# Patient Record
Sex: Female | Born: 1970 | Race: White | Hispanic: No | Marital: Married | Smoking: Never smoker
Health system: Southern US, Community
[De-identification: ages and names within clinical notes are randomized; demographics above are authoritative.]

## PROBLEM LIST (undated history)

## (undated) ENCOUNTER — Emergency Department (HOSPITAL_BASED_OUTPATIENT_CLINIC_OR_DEPARTMENT_OTHER): Admission: EM | Payer: 59

## (undated) DIAGNOSIS — E079 Disorder of thyroid, unspecified: Secondary | ICD-10-CM

## (undated) DIAGNOSIS — J45909 Unspecified asthma, uncomplicated: Secondary | ICD-10-CM

## (undated) HISTORY — DX: Unspecified asthma, uncomplicated: J45.909

## (undated) HISTORY — DX: Disorder of thyroid, unspecified: E07.9

## (undated) HISTORY — PX: UMBILICAL HERNIA REPAIR: SHX196

---

## 1997-11-02 ENCOUNTER — Other Ambulatory Visit: Admission: RE | Admit: 1997-11-02 | Discharge: 1997-11-02 | Payer: Self-pay | Admitting: Gynecology

## 1998-11-25 ENCOUNTER — Other Ambulatory Visit: Admission: RE | Admit: 1998-11-25 | Discharge: 1998-11-25 | Payer: Self-pay | Admitting: Gynecology

## 2000-01-16 ENCOUNTER — Other Ambulatory Visit: Admission: RE | Admit: 2000-01-16 | Discharge: 2000-01-16 | Payer: Self-pay | Admitting: Obstetrics and Gynecology

## 2000-05-19 ENCOUNTER — Ambulatory Visit (HOSPITAL_COMMUNITY): Admission: RE | Admit: 2000-05-19 | Discharge: 2000-05-19 | Payer: Self-pay | Admitting: Obstetrics and Gynecology

## 2000-06-17 ENCOUNTER — Inpatient Hospital Stay (HOSPITAL_COMMUNITY): Admission: AD | Admit: 2000-06-17 | Discharge: 2000-06-17 | Payer: Self-pay | Admitting: Obstetrics and Gynecology

## 2000-06-19 ENCOUNTER — Inpatient Hospital Stay (HOSPITAL_COMMUNITY): Admission: AD | Admit: 2000-06-19 | Discharge: 2000-06-24 | Payer: Self-pay | Admitting: Obstetrics and Gynecology

## 2000-06-20 ENCOUNTER — Encounter: Payer: Self-pay | Admitting: Obstetrics and Gynecology

## 2000-06-23 ENCOUNTER — Encounter: Payer: Self-pay | Admitting: Obstetrics & Gynecology

## 2000-06-28 ENCOUNTER — Encounter (HOSPITAL_COMMUNITY): Admission: RE | Admit: 2000-06-28 | Discharge: 2000-07-08 | Payer: Self-pay | Admitting: Obstetrics and Gynecology

## 2000-07-01 ENCOUNTER — Encounter: Payer: Self-pay | Admitting: Obstetrics and Gynecology

## 2000-07-07 ENCOUNTER — Inpatient Hospital Stay (HOSPITAL_COMMUNITY): Admission: AD | Admit: 2000-07-07 | Discharge: 2000-07-11 | Payer: Self-pay | Admitting: Obstetrics and Gynecology

## 2000-07-07 ENCOUNTER — Encounter (INDEPENDENT_AMBULATORY_CARE_PROVIDER_SITE_OTHER): Payer: Self-pay

## 2000-07-07 ENCOUNTER — Encounter: Payer: Self-pay | Admitting: Obstetrics and Gynecology

## 2000-07-12 ENCOUNTER — Encounter: Admission: RE | Admit: 2000-07-12 | Discharge: 2000-08-11 | Payer: Self-pay | Admitting: Obstetrics and Gynecology

## 2001-09-22 ENCOUNTER — Other Ambulatory Visit: Admission: RE | Admit: 2001-09-22 | Discharge: 2001-09-22 | Payer: Self-pay | Admitting: Obstetrics and Gynecology

## 2002-07-06 ENCOUNTER — Inpatient Hospital Stay (HOSPITAL_COMMUNITY): Admission: AD | Admit: 2002-07-06 | Discharge: 2002-07-06 | Payer: Self-pay | Admitting: Obstetrics and Gynecology

## 2002-08-17 ENCOUNTER — Ambulatory Visit (HOSPITAL_COMMUNITY): Admission: RE | Admit: 2002-08-17 | Discharge: 2002-08-17 | Payer: Self-pay | Admitting: Obstetrics and Gynecology

## 2002-10-25 ENCOUNTER — Inpatient Hospital Stay (HOSPITAL_COMMUNITY): Admission: AD | Admit: 2002-10-25 | Discharge: 2002-10-28 | Payer: Self-pay | Admitting: Obstetrics and Gynecology

## 2002-10-25 ENCOUNTER — Encounter (INDEPENDENT_AMBULATORY_CARE_PROVIDER_SITE_OTHER): Payer: Self-pay

## 2002-12-05 ENCOUNTER — Other Ambulatory Visit: Admission: RE | Admit: 2002-12-05 | Discharge: 2002-12-05 | Payer: Self-pay | Admitting: Obstetrics and Gynecology

## 2003-12-27 ENCOUNTER — Other Ambulatory Visit: Admission: RE | Admit: 2003-12-27 | Discharge: 2003-12-27 | Payer: Self-pay | Admitting: Obstetrics and Gynecology

## 2005-01-12 ENCOUNTER — Other Ambulatory Visit: Admission: RE | Admit: 2005-01-12 | Discharge: 2005-01-12 | Payer: Self-pay | Admitting: Obstetrics and Gynecology

## 2009-02-15 ENCOUNTER — Encounter: Admission: RE | Admit: 2009-02-15 | Discharge: 2009-02-15 | Payer: Self-pay | Admitting: Obstetrics and Gynecology

## 2009-08-19 ENCOUNTER — Encounter: Admission: RE | Admit: 2009-08-19 | Discharge: 2009-08-19 | Payer: Self-pay | Admitting: Obstetrics and Gynecology

## 2010-08-22 NOTE — Op Note (Signed)
   Jennifer Carlson, Jennifer Carlson                          ACCOUNT NO.:  [id number]   MEDICAL RECORD NO.:  [id number]                   PATIENT TYPE:  INP   LOCATION:  9129                                 FACILITY:  [facility]   PHYSICIAN:  Timothy E. Earlene Plater, M.D.              DATE OF BIRTH:  1970-10-23   DATE OF PROCEDURE:  10/25/2002  DATE OF DISCHARGE:                                 OPERATIVE REPORT   PREOPERATIVE DIAGNOSIS:  Umbilical hernia.   POSTOPERATIVE DIAGNOSIS:  Umbilical hernia.   PROCEDURE:  Repair of umbilical hernia.   SURGEON:  Timothy E. Earlene Plater, M.D.   ASSISTANT:  Dineen Kid. Rana Snare, M.D.   ANESTHESIA:  Spinal.   [name] is 40 years old, is delivering her second pregnancy today by  cesarean section.  She has had an enlarging umbilical hernia following the  pregnancy and delivery of her twins two years ago, and she elects for  elective repair at this time.  She has been informed, counseled.  She is  identified and the permit signed.   The patient is under spinal anesthesia, having been delivered of a viable  female baby this very morning by Dr. Rana Snare.  Please see a separate note.  The  patient is stable.  She is awake and alert.  She is having no pain in the  abdomen.  The abdomen had been previously prepped and draped.  An  infraumbilical incision made, the peritoneal sac easily identified.  It was  grasped, dissected from the surrounding scant subcutaneous tissue.  The  fascial edges were indistinct and somewhat problematic.  In the early stages  of her pregnancy the fascial edges were easy to delineate.  So with further  dissection I was able to grasp the edges of the fascia on either side of the  midline and approximate them with buried sutures of 0 Prolene.  This was  done under direct vision, of course.  The peritoneal sac was excised and  closed with a Vicryl and the hernia repair was complete.  The subcuticular  tissue was closed with a running 3-0 Monocryl.   Steri-Strips applied.  She  tolerated it well.  Final counts correct.  She was removed from the recovery  room in good condition.   She will be admitted and cared for by Dr. Rana Snare and will see me in follow-up.                                               Timothy E. Earlene Plater, M.D.   TED/MEDQ  D:  10/25/2002  T:  10/25/2002  Job:  [id number]   cc:   Dineen Kid. Rana Snare, M.D.  [address]  [place]  [place] [address]  Fax: [phone]

## 2010-08-22 NOTE — Discharge Summary (Signed)
[facility]  Patient:    Jennifer Carlson, Jennifer Carlson                       MRN: [id number] Adm. Date:  [id number] Disc. Date: 06/24/00 Attending:  Cordelia Pen Ii Dictator:   Danie Chandler, R.N.                           Discharge Summary  ADMITTING DIAGNOSES:          Intrauterine pregnancy at 50 1/[redacted] weeks gestation with preterm labor and twin gestation.  DISCHARGE DIAGNOSES:          Intrauterine pregnancy at 5 1/[redacted] weeks gestation with preterm labor and twin gestation, questionable discordancy, two vessel cord of twin A.  PROCEDURE:                    Magnesium sulfate tocolysis, intravenous antibiotics, betamethasone.  REASON FOR ADMISSION:         The patient is a 40 year old married white female gravida 1, para 0 with an estimated date of confinement of Aug 20, 2000 placing her at approximately 75 1/7 weeks estimated gestational age.  The pregnancy has been complicated by a twin gestation which was treated with terbutaline three days ago and the patient has been on p.o. terbutaline, a group B beta strep status which is pending for which the patient has been on Ceftin and asthma for which the patient uses a Maxair inhaler as needed.  The patient was complaining of uterine contractions.  There was no decrease with p.o. terbutaline.  There was no rupture of membranes or bleeding.  The cervix on admission was 1 cm, 50%, ballottable, and soft.  Fetal heart tones were reactive and the patient was having uterine contractions every two to four minutes.  Thus, the patient was admitted with preterm labor, placed on magnesium sulfate tocolysis, Ancef, and betamethasone.  LABORATORIES:                 Hemoglobin 11.6, hematocrit 33.1, white blood cell count 12.1.                                On hospitalization day #1 the patient was without complaint.  The patient was continued on magnesium sulfate and on hospitalization day #2 the magnesium sulfate  was weaned.  The insurance would not cover a subcutaneous terbutaline pump so the patient was placed on p.o. terbutaline.  On hospitalization day #3 the patient was having minimal contractions on the p.o. terbutaline.  Fetal heart tones were reactive.  An ultrasound did show discordant growth.  On hospitalization day #4 the patient was continued on p.o. terbutaline.  The ultrasound was consistent with discordancy.  The home uterine activity monitor was arranged for the patient to be discharged home and on the following day she was discharged home without complaint.  CONDITION ON DISCHARGE:       Stable.  FOLLOW-UP:                    She is to follow-up in the office in the morning.  She is to call for any signs or symptoms of preterm labor.  She will follow-up in the office for serial ultrasounds and monitoring.  DISCHARGE MEDICATIONS:        1. Prenatal vitamins one p.o. q.d.  2. Terbutaline as directed by M.D.                               3. Home uterine activity monitor. DD:  07/07/00 TD:  07/07/00 Job: [id number]

## 2010-08-22 NOTE — H&P (Signed)
[facility]  Patient:    KAITLYNNE, WENZ                       MRN: [id number] Adm. Date:  [id number] Attending:  Cordelia Pen Ii                         History and Physical  HISTORY OF PRESENT ILLNESS:  Ms. Gavin is a 40 year old, G1, P0 at 106 and 6/7th weeks estimated gestational age with a twin pregnancy. They are presumed monochorionic female infants. The pregnancy has been complicated by preterm labor and at 31 weeks discordant weight with twin A measuring 55th percentile at 31 weeks, twin B 25th percentile, at 70 weeks twin A was 45th percentile with twin B measuring 3rd percentile. The patient underwent Doppler flow which showed twin A with normal Doppler flow with an SD ratio of 2.2, twin B showed an elevated SD ratio of 3.8, but at that time had normal biophysical profile. She had a reactive nonstress test yesterday. Her cervix was closed, 50%, -3. She underwent a Doppler ultrasound today which showed worsening Doppler flow of twin B now with a Doppler flow of 4.85 with twin A normal. Again, the upper limits of normal is 3.3. Her estimated date of confinement is Aug 19, 2000. Because of worsening Doppler flow of twin B, intrauterine growth restriction planned delivery of the twins discussed at length of options with the patient and she presents today for monitoring and primary cesarean section.  Her blood type is RH negative with her husband B positive. She received RhoGAM at 28 weeks.  PAST MEDICAL HISTORY:   Asthma.  PAST SURGICAL HISTORY:  Negative.  PAST GYN HISTORY:  No history of sexually transmitted diseases or abnormal Pap smears.  CURRENT MEDICATIONS:  Terbutaline.  ALLERGIES:  No known drug allergies.  PHYSICAL EXAMINATION:  VITAL SIGNS:  Blood pressure 120/80. CARDIOVASCULAR:  Regular rate and rhythm. LUNGS:  Clear to auscultation bilaterally. ABDOMEN:  Nondistended, but gravid, fundal height 40 cm. PELVIC:   Cervix is closed, 50% and -3.  IMPRESSION AND PLAN:  Twin pregnancy, vertex breech presentation, twin B has intrauterine growth restriction which is worsening with estimated fetal weight of less than 3rd percentile and an SD ratio continuing to rise with the latest ratio of 4.85. Because of worsening intrauterine growth rate will plan delivery of the twins. Had a lengthy discussion with the patient and her husband. They desire delivery at this time. Also, with the neonatal intensive care nursery, they are ready for these deliveries because of baby B being breech and growth restricted, planned primary low transverse cesarean section. The risks and benefits were discussed and informed consent was obtained. DD:  07/07/00 TD:  07/07/00 Job: [id number]

## 2010-08-22 NOTE — Op Note (Signed)
Jennifer Carlson, Jennifer Carlson                          ACCOUNT NO.:  [id number]   MEDICAL RECORD NO.:  [id number]                   PATIENT TYPE:  INP   LOCATION:  9129                                 FACILITY:  [facility]   PHYSICIAN:  Dineen Kid. Rana Snare, M.D.                 DATE OF BIRTH:  June 11, 1970   DATE OF PROCEDURE:  10/25/2002  DATE OF DISCHARGE:                                 OPERATIVE REPORT   PREOPERATIVE DIAGNOSES:  1. Intrauterine pregnancy at 39 weeks.  2. Previous cesarean section and desires repeat.  3. Umbilical hernia.   POSTOPERATIVE DIAGNOSES:  1. Intrauterine pregnancy at 39 weeks.  2. Previous cesarean section and desires repeat.  3. Umbilical hernia.   PROCEDURE:  Repeat low segment transverse cesarean section and umbilical  herniorrhaphy per Dr. Earlene Plater.   ANESTHESIA:  Spinal.   ESTIMATED BLOOD LOSS:  800 mL.   SURGEON:  Dineen Kid. Rana Snare, M.D.   ASSISTANT:  Juluis Mire, M.D.   INDICATIONS FOR PROCEDURE:  Ms. Flenniken is a 40 year old G2, P1 with twins  at home who had an uncomplicated pregnancy with previous cesarean section  and desires repeat.  She presents today for this.  The risks and benefits  were discussed.  Informed consent was obtained.  She does have an umbilical  hernia which she is going to have repaired by Dr. Earlene Plater at the same time.   FINDINGS:  A viable female infant with Apgars 9 and 9, pH 7.30, and weight  is 7 pounds 11 ounces.   DESCRIPTION OF PROCEDURE:  After adequate analgesia, the patient was placed  in the supine position with left lateral tilt.  She was sterilely prepped  and draped.  The bladder was sterilely drained with Foley catheter.  A  Pfannenstiel skin incision was made, the previous skin incision was sharply  excised.  The fascia was taken down sharply in the midline. It was extended  superiorly and inferiorly off the bellies of the rectus muscle which was  separated sharply in the midline.  The peritoneum was entered sharply.  Bladder flap was created and replaced by the bladder blade.  A low segment  _______ incision is taken down to the infant's vertex.  It is extended  laterally with the operator's fingertips.  The infant was delivered, nares  and pharynx suctioned.  Cord was clamped, cut and handed to the pediatrician  for resuscitation.  Cord blood was obtained.  Placenta extracted manually.  The uterus was exteriorized, wiped with a dry lap.  The myotomy incision was  closed in two layers, the first being a running locked and the second being  an imbricating layer.  After adequate hemostasis was achieved, the uterus  was placed back in the peritoneal cavity.  After copious amount of  irrigation, adequate hemostasis was assured, 0 Monocryl was used to close  the peritoneum and rectus muscles were plicated in  the midline.  Irrigation  was applied.  After adequate hemostasis, the fascia was closed with #1  Vicryl in running fashion.  Irrigation was supplied after adequate  hemostasis.  The skin was stapled and Steri-Strips were applied.  The  estimated blood loss was 800 mL.  The patient received 1 g of cefotetan  after delivery of the placenta.  The patient was in stable condition and  sponge and instrument counts were normal x3 when Dr. Earlene Plater began his  herniorrhaphy. This will be dictated separately.                                               Dineen Kid Rana Snare, M.D.    DCL/MEDQ  D:  10/25/2002  T:  10/25/2002  Job:  [id number]

## 2010-08-22 NOTE — Discharge Summary (Signed)
[facility]  Patient:    Jennifer Carlson, Jennifer Carlson                       MRN: [id number] Adm. Date:  [id number] Disc. Date: [id number] Attending:  Trevor Iha Dictator:   Danie Chandler, R.N.                           Discharge Summary  ADMISSION DIAGNOSES:          1. Intrauterine two pregnancy at approximately                                  34 weeks estimated gestational age.                               2. Vertex breech presentation.                               3. Intrauterine growth retardation of twin B                                  with poor Doppler of umbilical cord.  DISCHARGE DIAGNOSES:          1. Intrauterine two pregnancy at approximately                                  34 weeks estimated gestational age.                               2. Vertex breech presentation.                               3. Intrauterine growth retardation of twin B                                  with poor Doppler of umbilical cord.  PROCEDURES:                   On July 07, 2000, primary low transverse cesarean section.  REASON FOR ADMISSION:         Please see dictated H&P.  HOSPITAL COURSE:              The patient was taken to the operating room and underwent the above-named procedure without complication.  This was productive of twin A, a viable, female infant with Apgars of 7 at one minute and 8 at five minutes and an arterial cord pH of 7.32, and twin B, a viable, female infant with Apgars of 8 at one minute and 9 at five minutes and an arterial cord pH of 7.34.  Postoperatively, on day #1, the patients hemoglobin was 11.6, hematocrit 32.3, and white blood cell count 12.2.  The patient was without complaint on this day.  On postoperative day #2, the patient was doing well and eating a regular diet.  She had a good return of bowel function and good pain  control.  She was also ambulating well without difficulty.  The babies were stable in the NICU.  On  postoperative day #3, the patient was without complaint.  The babies were continuing to do well in the NICU.  DISPOSITION:                  She as discharged home on postoperative day #4.  CONDITION ON DISCHARGE:       Good.  DIET:                         Regular as tolerated.  ACTIVITY:                     No heavy lifting.  No driving.  No vaginal entry.  FOLLOW-UP:                    She is to follow up in the office in one week for an incision check.  SPECIAL INSTRUCTIONS:         She is to call for a temperature greater than 100 degrees, persistent nausea, vomiting, heavy vaginal bleeding, and/or redness or drainage from the incision site.  DISCHARGE MEDICATIONS:        1. Prenatal vitamins one p.o. q.d.                               2. Tylox as directed by M.D. DD:  07/27/00 TD:  07/27/00 Job: [id number]

## 2010-08-22 NOTE — Op Note (Signed)
[facility]  Patient:    Jennifer Carlson, Jennifer Carlson                       MRN: [id number] Proc. Date: 07/07/00 Adm. Date:  [id number] Disc. Date: [id number] Attending:  Cordelia Pen Ii                           Operative Report  PREOPERATIVE DIAGNOSIS:       Intrauterine twins, vertex breech presentation, 34 weeks estimated gestational age intrauterine growth retardation of twin B with poor Doppler of umbilical cord.  POSTOPERATIVE DIAGNOSIS:      Intrauterine twins, vertex breech presentation, 34 weeks estimated gestational age intrauterine growth retardation of twin B with poor Doppler of umbilical cord.  PROCEDURE:                    Primary low transverse cesarean section.  SURGEON:                      Trevor Iha, M.D.  ANESTHESIA:                   Spinal.  ESTIMATED BLOOD LOSS:         800 cc.  INDICATIONS:                  The patient is a 40 year old G1, P0 at approximately 47 weeks estimated gestational age whose pregnancy has been complicated by discordant twins with IUGR of twin B.  Estimated fetal weight is less than the third percentile.  Doppler flow shows worsening systolic/diastolic ratio with an S/D ratio of 4.85 today.  Because of worsening umbilical artery blood flow and IUGR, plan delivery of the twins. Because of vertex breech presentation and IUGR, plan primary low transverse cesarean section.  The risks and benefits were discussed and informed consent was obtained.  See the history and physical for further details.  FINDINGS AT THE TIME OF SURGERY:              Twin A was a viable female infant with Apgars of 7 and 8.  Arterial pH was 7.32.  Weight was 5 lb 5 oz.  Twin B was a viable female infant with Apgars of 8 and 9.  Arterial pH was 7.34.  Weight was 4 lb 1 oz. Normal uterus, tubes and ovaries were noted.  DESCRIPTION OF PROCEDURE:     After adequate analgesia, the patient was placed in the supine position with a  left lateral tilt.  She was sterilely prepped and draped.  The bladder was sterilely drained.  A Pfannenstiel skin incision was made two fingerbreadths above the pubic symphysis and was taken down sharply to the fascia.  The fascia was incised transversely and extended superiorly and inferiorly and off the bellies of the rectus muscle.  The rectus muscles were separated sharply in the midline.  The peritoneum was then entered sharply.  A bladder blade was placed.  The uterine serosa was elevated, nicked and incised transversely.  A bladder flap was created and placed behind the bladder blade.  A low cervical myotomy incision was made down to the amniotic scan.  Amniotomy was performed.  The vertex of twin A was easily grasped and delivered atraumatically.  The nares and pharynx were suctioned.  The infant was then delivered.  The cord was clamped and the infant was handed  to the pediatricians.  Good cry was noted.  Cord blood was then obtained.  The cord was then marked with umbilical clamp.  Amniotomy was then performed on the bag of twin B.  The buttocks were easily delivered through the incision.  The arms were reduced.  The head was then easily delivered.  The nares and pharynx were then suctioned.  Good cry was noted. The cord was clamped.  The infant was handed to the pediatricians.  Cord blood was then obtained.  The placenta was extracted manually.  Noted was a monochorionic placenta.  There appeared to be two membranes separating the infants consistent with a monochorionic/diamniotic probable monozygotic twins. The uterus was exteriorized and wiped clean with a dry lap.  The myotomy incision was closed in two layers, the first layer being a running locking layer, the second being an imbricating layer with good approximation and good hemostasis.  The uterus was placed back in the peritoneal cavity.  After a copious amount of irrigation, adequate hemostasis was assured.  The  peritoneum was closed with 0 Monocryl  The rectus muscle was plicated in the midline. Irrigation was applied.  After adequate hemostasis, the fascia was closed with 0 Panacryl in a running fashion with good approximation and good hemostasis. Irrigation was again applied and, after adequate hemostasis, the skin was stapled and Steri-Strips applied.  The patient tolerated the procedure well and was stable on transfer to the recovery room.  Sponge, needle and instrument count was normal x 3.  The patient received 1 g of cefotetan after delivery of the placenta. DD:  07/07/00 TD:  07/08/00 Job: [id number]

## 2010-08-22 NOTE — Discharge Summary (Signed)
Jennifer Carlson, Jennifer Carlson                          ACCOUNT NO.:  [id number]   MEDICAL RECORD NO.:  [id number]                   PATIENT TYPE:  INP   LOCATION:  9136                                 FACILITY:  [facility]   PHYSICIAN:  Tracie Harrier, M.D.              DATE OF BIRTH:  14-Nov-1970   DATE OF ADMISSION:  10/25/2002  DATE OF DISCHARGE:  10/28/2002                                 DISCHARGE SUMMARY   ADMISSION DIAGNOSES:  1. Intrauterine pregnancy at 16 weeks estimated gestational age.  2. Previous cesarean, desires repeat.  3. Umbilical hernia.   DISCHARGE DIAGNOSES:  1. Status post low transverse cesarean section.  2. Viable female infant.  3. Repair of umbilical hernia.   PROCEDURE:  1. Repeat low transverse cesarean section.  2. Umbilical hernia repair by Sheppard Plumber. Earlene Plater, M.D. which has been dictated     separately.   REASON FOR ADMISSION:  Please see dictated H&P.   HOSPITAL COURSE:  The patient was a 40 year old gravida 2, para 1 who had  had a previous cesarean delivery for a twin gestation whose pregnancy had  been uncomplicated and the patient had desired to have a repeat cesarean  delivery.  The patient also had an umbilical hernia which was to be repaired  at the time of her surgery.  On the morning of admission, the patient was  taken to the operating room where spinal anesthesia was administered without  difficulty.  A low transverse incision was made with the delivery of a  viable female infant weighing 7 pounds 11 ounces, Apgars of 9 at one minute  9 at five minutes.  Umbilical cord pH was 7.30.  Umbilical hernia repair was  performed by Marcial Pacas E. Earlene Plater, M.D. and the patient the patient tolerated  the procedure well and was taken to the recovery room in stable condition.  On postoperative day #1, vital signs were stable, the patient remained  afebrile, abdomen was soft with good return of bowel function, fundus was  firm and nontender.  Abdominal dressing was  clean, dry, and intact.  Labs  revealed hemoglobin of 11.9, platelet count of 163,000, WBC count of 9.1.  On postoperative day #2, the patient did complain of some tenderness at the  umbilical incision, vital signs were stable, she remained afebrile, fundus  was firm and nontender.  Umbilical incision was noted to be intact without  erythema and no drainage.  Low abdominal incision was noted to have a small  amount of oozing at the left of the midline and at the right margin.  On  postoperative day #3, the patient was doing well, vital signs were stable,  she remained afebrile.  Incision was clean, dry, and intact.  Staples were  removed and the patient was actually discharged home.   CONDITION ON DISCHARGE:  Good.   DIET:  Regular as tolerated.   ACTIVITY:  No heavy lifting,  no driving x2 weeks, no vaginal entry.   FOLLOW UP:  The patient is to followup in the office in one week for an  incision check.  She is to call for a temperature greater than 100 degrees,  persistent nausea and vomiting, heavy vaginal bleeding, and/or redness or  drainage from the incisional site.   DISCHARGE MEDICATIONS:  1. Percocet 5/325 mg, dispense #30, one p.o. q.4-6 h.  2. Motrin 600 mg every 6 hours p.r.n.  3. Prenatal vitamins one p.o. daily.  4. Colace one p.o. daily p.r.n.     Julio Sicks, N.P.                        Tracie Harrier, M.D.    CC/MEDQ  D:  11/21/2002  T:  11/21/2002  Job:  [id number]

## 2010-08-22 NOTE — H&P (Signed)
   Jennifer Carlson                          ACCOUNT NO.:  [id number]   MEDICAL RECORD NO.:  [id number]                   PATIENT TYPE:  INP   LOCATION:  NA                                   FACILITY:  [facility]   PHYSICIAN:  Dineen Kid. Rana Snare, M.D.                 DATE OF BIRTH:  Oct 13, 1970   DATE OF ADMISSION:  10/24/2002  DATE OF DISCHARGE:                                HISTORY & PHYSICAL   HISTORY OF PRESENT ILLNESS:  Jennifer Carlson is a 40 year old G2, P60 with twins  from a previous pregnancy with an estimated date of confinement of November 02, 2002 currently [redacted] weeks pregnant presents for repeat cesarean section.  Her  pregnancy has been uncomplicated.  Her group B strep is negative.  Her  previous cesarean section was due to twins with IUGR but she desires repeat  cesarean section.  She also has an umbilical hernia which is going to be  repaired by Dr. Earlene Plater.   PAST MEDICAL HISTORY:  Asthma.   PAST SURGICAL HISTORY:  Cesarean section April 2002 and wisdom teeth  extraction.   PAST OBSTETRICAL HISTORY:  Twin boys (identical) in April 2002.   MEDICATIONS:  Prenatal vitamins and albuterol as needed.   ALLERGIES:  She is allergic to AUGMENTIN which gives her hives.  The patient  did receive Cefotan with the last delivery and did not have any problems  with it, however.   PHYSICAL EXAMINATION:  VITAL SIGNS:  Blood pressure is 110/72.  HEART:  Regular rate and rhythm.  LUNGS:  Clear to auscultation bilaterally.  ABDOMEN:  Gravid, nontender.  CERVIX:  2, 75%, -2 station.   IMPRESSION AND PLAN:  Intrauterine pregnancy at 39 weeks, previous cesarean  section with desired repeat, also with umbilical hernia.  Plan repeat low  transverse cesarean section.  Risks and benefits were discussed.  Informed  consent was obtained.  Dr. Earlene Plater is to follow with umbilical herniorrhaphy.                                               Dineen Kid Rana Snare, M.D.    DCL/MEDQ  D:  10/24/2002  T:  10/24/2002   Job:  [id number]

## 2010-12-10 IMAGING — US US BREAST L
1 series · 4 of 4 positions shown · non-contrast
Comparison: None, baseline

CLINICAL DATA: Palpable abnormality left breast.

DIGITAL DIAGNOSTIC  BILATERAL  MAMMOGRAM  WITH CAD AND LEFT BREAST
ULTRASOUND:

[Series 1: us breast left · 4 of 4 slices shown]
[im 1/4]
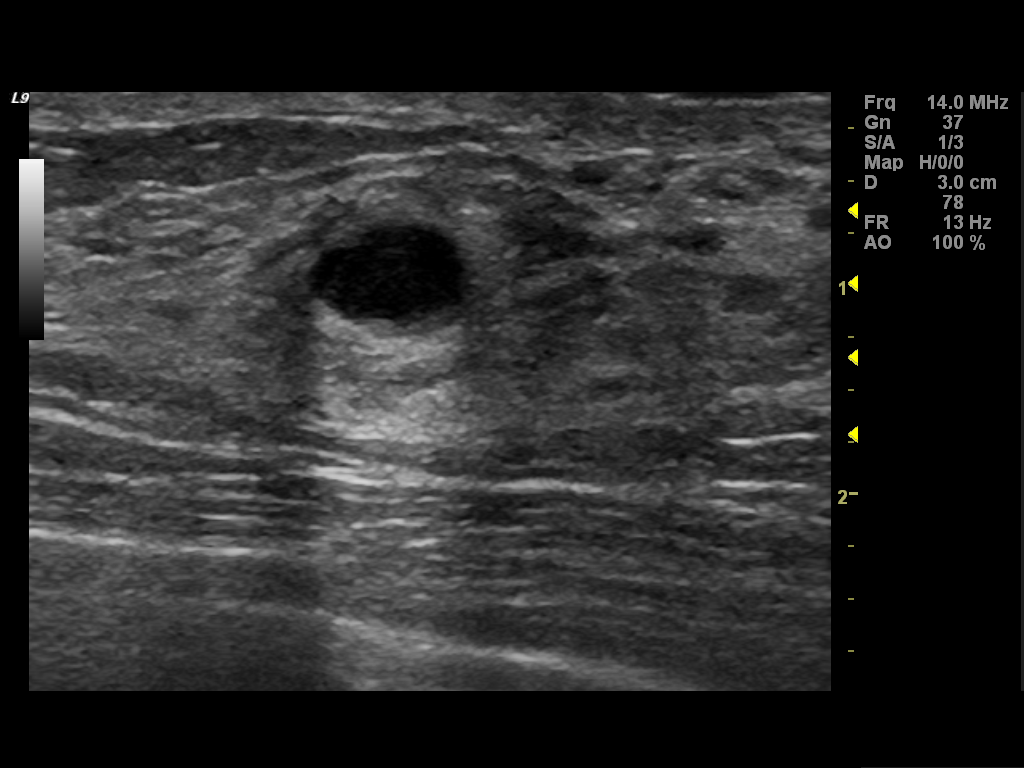
[im 2/4]
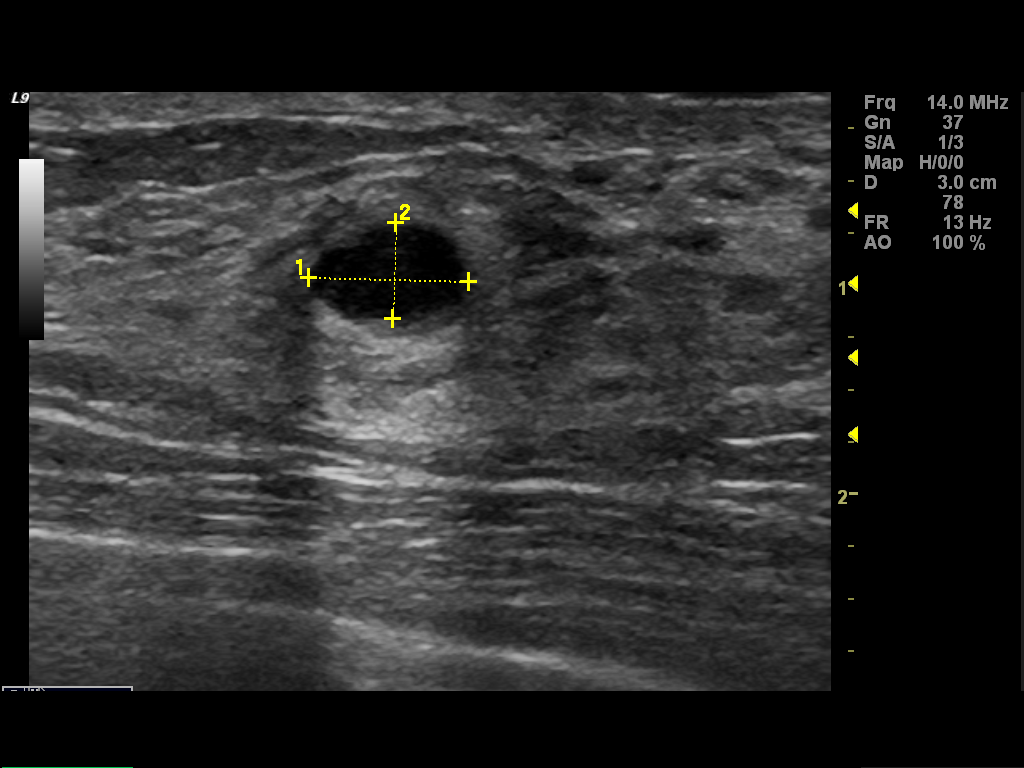
[im 3/4]
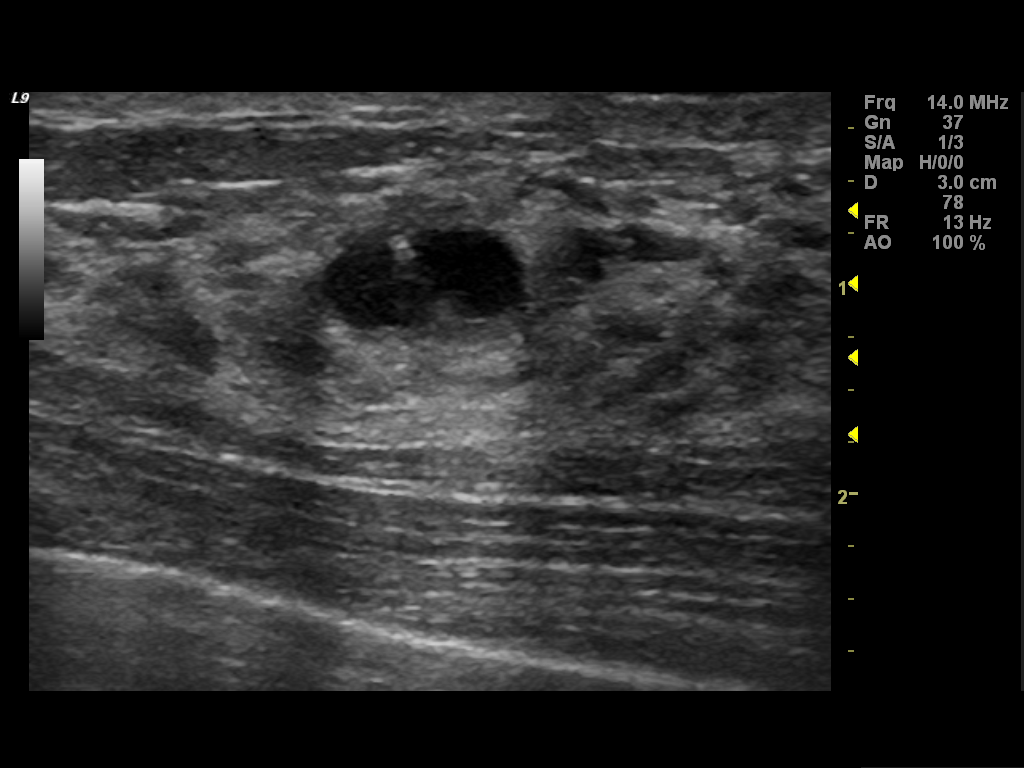
[im 4/4]
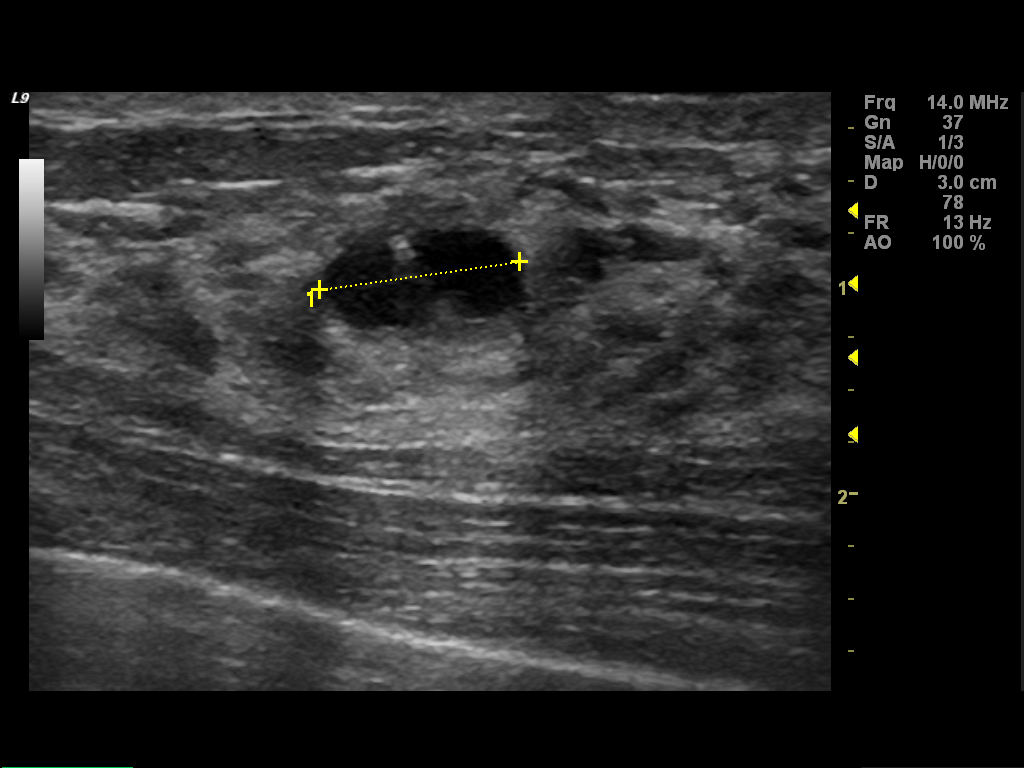

[4 of 4 positions shown; findings below may reference images not displayed]

FINDINGS: Breast parenchyma is extremely dense.  Calcifications are
identified bilaterally, further evaluated with magnified views.  On
the right, calcifications in the 6 o'clock location clearly layer,
consistent with milk of calcium type calcifications.  No mass or
distortion identified on the right.

On the left, faint calcifications are also identified in the 6
o'clock location.  On magnified views, these calcifications are
faint but also appeared later.  Findings are most consistent with
milk of calcium type calcifications and follow-up magnified views
are suggested in 6 months to assess stability.  A BB marks area of
patient's concern on the left, corresponding to the dense
fibroglandular tissue without definite mass or distortion.
Mammographic images were processed with CAD.

On physical exam, I do palpate a rounded nodule the [DATE] position
of the left breast 3 cm from the nipple.

Ultrasound is performed, showing a cyst in the [DATE] position of the
left breast 3 cm from the nipple.  This measures 1.0 x 0.8 x
cm.  Findings are consistent with a thick-walled cyst, containing a
single septation.  No internal blood flow is identified.  Findings
favor an inflamed cyst.  The patient reports that this was quite
tender 1 week ago that tenderness has improved.  No solid mass or
area of acoustic shadowing is identified within this region.
IMPRESSION: 1.  Benign appearing calcifications on the right.
2.  Calcifications on the left probably benign.  I would recommend
a followup with left mammogram to assess stability.
3.  Inflamed cyst [DATE] position of the left breast.  Follow-up left
breast ultrasound is suggested in 6 months.

BI-RADS CATEGORY 3:  Probably benign finding(s) - short interval
follow-up suggested.

## 2015-05-08 DIAGNOSIS — I8311 Varicose veins of right lower extremity with inflammation: Secondary | ICD-10-CM | POA: Diagnosis not present

## 2015-05-08 DIAGNOSIS — I8312 Varicose veins of left lower extremity with inflammation: Secondary | ICD-10-CM | POA: Diagnosis not present

## 2015-05-14 MED FILL — BENZONATATE 100 MG CAPSULE: 100 | 10 days supply | Qty: 30 | Fill #0

## 2015-05-17 MED FILL — TRI-LEGEST FE-28 DAY TABLET: 1-20/1-30/1 | 84 days supply | Qty: 84 | Fill #4

## 2015-05-24 DIAGNOSIS — Z6825 Body mass index (BMI) 25.0-25.9, adult: Secondary | ICD-10-CM | POA: Diagnosis not present

## 2015-05-24 DIAGNOSIS — Z01411 Encounter for gynecological examination (general) (routine) with abnormal findings: Secondary | ICD-10-CM | POA: Diagnosis not present

## 2015-05-24 DIAGNOSIS — R319 Hematuria, unspecified: Secondary | ICD-10-CM | POA: Diagnosis not present

## 2015-05-24 DIAGNOSIS — Z01419 Encounter for gynecological examination (general) (routine) without abnormal findings: Secondary | ICD-10-CM | POA: Diagnosis not present

## 2015-05-24 DIAGNOSIS — Z1231 Encounter for screening mammogram for malignant neoplasm of breast: Secondary | ICD-10-CM | POA: Diagnosis not present

## 2015-05-28 DIAGNOSIS — Z1329 Encounter for screening for other suspected endocrine disorder: Secondary | ICD-10-CM | POA: Diagnosis not present

## 2015-05-28 DIAGNOSIS — Z13220 Encounter for screening for lipoid disorders: Secondary | ICD-10-CM | POA: Diagnosis not present

## 2015-05-28 DIAGNOSIS — Z1321 Encounter for screening for nutritional disorder: Secondary | ICD-10-CM | POA: Diagnosis not present

## 2015-05-28 MED FILL — ZOLPIDEM TARTRATE 10 MG TAB: 10 | 90 days supply | Qty: 90 | Fill #0

## 2015-06-03 DIAGNOSIS — R3121 Asymptomatic microscopic hematuria: Secondary | ICD-10-CM | POA: Diagnosis not present

## 2015-06-03 DIAGNOSIS — Z0000 Encounter for general adult medical examination without abnormal findings: Secondary | ICD-10-CM | POA: Diagnosis not present

## 2015-06-04 ENCOUNTER — Other Ambulatory Visit (HOSPITAL_COMMUNITY): Payer: Self-pay | Admitting: Urology

## 2015-06-04 DIAGNOSIS — R3121 Asymptomatic microscopic hematuria: Secondary | ICD-10-CM

## 2015-06-20 ENCOUNTER — Ambulatory Visit (HOSPITAL_COMMUNITY)
Admission: RE | Admit: 2015-06-20 | Discharge: 2015-06-20 | Disposition: A | Discharge status: Home / Self Care | Payer: 59 | Source: Ambulatory Visit | Attending: Urology | Admitting: Urology

## 2015-06-20 DIAGNOSIS — R3129 Other microscopic hematuria: Secondary | ICD-10-CM | POA: Diagnosis not present

## 2015-06-20 DIAGNOSIS — R3121 Asymptomatic microscopic hematuria: Secondary | ICD-10-CM

## 2015-07-23 DIAGNOSIS — E559 Vitamin D deficiency, unspecified: Secondary | ICD-10-CM | POA: Diagnosis not present

## 2015-08-12 MED FILL — TRI-LEGEST FE-28 DAY TABLET: 1-20/1-30/1 | 84 days supply | Qty: 84 | Fill #0

## 2015-10-29 MED FILL — ZOLPIDEM TARTRATE 10 MG TAB: 10 | 90 days supply | Qty: 90 | Fill #1

## 2015-10-29 MED FILL — TRI-LEGEST FE-28 DAY TABLET: 1-20/1-30/1 | 84 days supply | Qty: 84 | Fill #1

## 2016-01-20 MED FILL — TRI-LEGEST FE-28 DAY TABLET: 1-20/1-30/1 | 84 days supply | Qty: 84 | Fill #2

## 2016-04-02 MED FILL — ZOLPIDEM TARTRATE 10 MG TAB: 10 | 90 days supply | Qty: 90 | Fill #0

## 2016-04-15 MED FILL — TRI-LEGEST FE-28 DAY TABLET: 1-20/1-30/1 | 84 days supply | Qty: 84 | Fill #3

## 2016-05-26 ENCOUNTER — Other Ambulatory Visit: Payer: Self-pay | Admitting: Obstetrics and Gynecology

## 2016-05-26 DIAGNOSIS — R928 Other abnormal and inconclusive findings on diagnostic imaging of breast: Secondary | ICD-10-CM

## 2016-05-29 ENCOUNTER — Ambulatory Visit
Admission: RE | Admit: 2016-05-29 | Discharge: 2016-05-29 | Disposition: A | Discharge status: Home / Self Care | Payer: 59 | Source: Ambulatory Visit | Attending: Obstetrics and Gynecology | Admitting: Obstetrics and Gynecology

## 2016-05-29 DIAGNOSIS — R928 Other abnormal and inconclusive findings on diagnostic imaging of breast: Secondary | ICD-10-CM

## 2016-07-07 MED FILL — TRI-LEGEST FE-28 DAY TABLET: 1-20/1-30/1 | 84 days supply | Qty: 84 | Fill #0

## 2016-08-25 MED FILL — PREVIDENT 5000 BOOSTER PLUS: 1.1 | 30 days supply | Qty: 100 | Fill #0

## 2016-09-23 MED FILL — TRI-LEGEST FE-28 DAY TABLET: 1-20/1-30/1 | 84 days supply | Qty: 84 | Fill #0

## 2016-10-14 MED FILL — ZOLPIDEM TARTRATE 10 MG TAB: 10 | 30 days supply | Qty: 30 | Fill #0

## 2016-10-21 MED FILL — BETAMETHASONE DP AUG 0.05%: 0.05 | 30 days supply | Qty: 60 | Fill #0

## 2016-12-02 MED FILL — PREVIDENT 5000 BOOSTER PLUS: 1.1 | 30 days supply | Qty: 100 | Fill #1

## 2016-12-15 MED FILL — TRI-LEGEST FE-28 DAY TABLET: 1-20/1-30/1 | 84 days supply | Qty: 84 | Fill #1

## 2016-12-29 MED FILL — ZOLPIDEM TARTRATE 10 MG TAB: 10 | 30 days supply | Qty: 30 | Fill #0

## 2017-02-08 MED FILL — PREVIDENT 5000 BOOSTER PLUS: 1.1 | 30 days supply | Qty: 100 | Fill #2

## 2017-02-10 MED FILL — ZOLPIDEM TARTRATE 10 MG TAB: 10 | 30 days supply | Qty: 30 | Fill #0

## 2017-03-03 MED FILL — PAZEO 0.7% EYE DROPS: 0.7 | 25 days supply | Qty: 3 | Fill #0

## 2017-03-16 MED FILL — TRI-LEGEST FE-28 DAY TABLET: 1-20/1-30/1 | 84 days supply | Qty: 84 | Fill #2

## 2017-04-02 MED FILL — PAZEO 0.7% EYE DROPS: 0.7 | 25 days supply | Qty: 3 | Fill #1

## 2017-04-05 MED FILL — ZOLPIDEM TARTRATE 10 MG TAB: 10 | 30 days supply | Qty: 30 | Fill #0

## 2017-05-26 DIAGNOSIS — Z01419 Encounter for gynecological examination (general) (routine) without abnormal findings: Secondary | ICD-10-CM | POA: Diagnosis not present

## 2017-05-26 DIAGNOSIS — Z1231 Encounter for screening mammogram for malignant neoplasm of breast: Secondary | ICD-10-CM | POA: Diagnosis not present

## 2017-05-26 DIAGNOSIS — Z6826 Body mass index (BMI) 26.0-26.9, adult: Secondary | ICD-10-CM | POA: Diagnosis not present

## 2017-05-26 LAB — HM PAP SMEAR: HM PAP: NEGATIVE

## 2017-05-26 MED FILL — TRI-LEGEST FE-28 DAY TABLET: 1-20/1-30/1 | 84 days supply | Qty: 84 | Fill #0

## 2017-05-27 LAB — HM MAMMOGRAPHY

## 2017-06-03 ENCOUNTER — Ambulatory Visit (INDEPENDENT_AMBULATORY_CARE_PROVIDER_SITE_OTHER): Payer: 59 | Admitting: Physician Assistant

## 2017-06-03 ENCOUNTER — Encounter: Payer: Self-pay | Admitting: Physician Assistant

## 2017-06-03 VITALS — BP 133/83 | HR 70 | Resp 14 | Ht 61.0 in | Wt 141.0 lb

## 2017-06-03 DIAGNOSIS — Z7689 Persons encountering health services in other specified circumstances: Secondary | ICD-10-CM

## 2017-06-03 DIAGNOSIS — J4520 Mild intermittent asthma, uncomplicated: Secondary | ICD-10-CM | POA: Diagnosis not present

## 2017-06-03 DIAGNOSIS — G479 Sleep disorder, unspecified: Secondary | ICD-10-CM | POA: Diagnosis not present

## 2017-06-03 DIAGNOSIS — J45990 Exercise induced bronchospasm: Secondary | ICD-10-CM

## 2017-06-03 DIAGNOSIS — Z2821 Immunization not carried out because of patient refusal: Secondary | ICD-10-CM

## 2017-06-03 MED ORDER — ALBUTEROL SULFATE HFA 108 (90 BASE) MCG/ACT IN AERS: INHALATION_SPRAY | RESPIRATORY_TRACT | 3 refills | Status: DC

## 2017-06-03 MED ORDER — ZOLPIDEM TARTRATE 10 MG PO TABS: 10.0000 mg | ORAL_TABLET | Freq: Every evening | ORAL | 2 refills | Status: DC | PRN

## 2017-06-03 MED FILL — ZOLPIDEM TARTRATE 10 MG TAB: 10 | 30 days supply | Qty: 30 | Fill #0

## 2017-06-03 MED FILL — VENTOLIN HFA 90 MCG INHALER: 108 (90 BAS | 30 days supply | Qty: 18 | Fill #0

## 2017-06-03 NOTE — Progress Notes (Signed)
HPI:                                                                Jennifer Carlson is a 47 y.o. female who presents to [facility] today to establish care  Current concerns: medication refills  Asthma: since childhood. Triggered by exertion and allergies. She does endorse intermittent nocturnal chest tightness. No asthma exacerbations in the last year.   Insomnia: takes Ambien about once a week as needed for sleep difficulties due to racing thoughts. No nighttime awakenings. Feels rested.   Depression screen [facility] 2/9 06/03/2017  Decreased Interest 0  Down, Depressed, Hopeless 0  PHQ - 2 Score 0    No flowsheet data found.    History reviewed. No pertinent past medical history. Past Surgical History:  Procedure Laterality Date  . CESAREAN SECTION     x 2 (2002, 2004)  . UMBILICAL HERNIA REPAIR     Social History   Tobacco Use  . Smoking status: Not on file  Substance Use Topics  . Alcohol use: Not on file   family history includes Breast cancer in her paternal grandmother.    ROS: negative except as noted in the HPI  Medications: Current Outpatient Medications  Medication Sig Dispense Refill  . docusate sodium (COLACE) 50 MG capsule Take 50 mg by mouth at bedtime.    . TRI-LEGEST FE 1-20/1-30/1-35 MG-MCG tablet Take 1 tablet by mouth daily.  3  . zolpidem (AMBIEN) 10 MG tablet Take 1 tablet (10 mg total) by mouth at bedtime as needed for sleep. 30 tablet 2  . albuterol (PROVENTIL HFA;VENTOLIN HFA) 108 (90 Base) MCG/ACT inhaler 1-2 puffs 30 minutes prior to exercise 2 Inhaler 3   No current facility-administered medications for this visit.    Allergies  Allergen Reactions  . Augmentin [Amoxicillin-Pot Clavulanate] Hives       Objective:  BP 133/83   Pulse 70   Resp 14   Ht 5\' 1"  (1.549 m)   Wt 141 lb (64 kg)   SpO2 98%   BMI 26.64 kg/m  Gen:  alert, not ill-appearing, no distress, appropriate for  age HEENT: head normocephalic without obvious abnormality, conjunctiva and cornea clear, trachea midline Pulm: Normal work of breathing, normal phonation, clear to auscultation bilaterally, no wheezes, rales or rhonchi CV: Normal rate, regular rhythm, s1 and s2 distinct, no murmurs, clicks or rubs  Neuro: alert and oriented x 3, no tremor MSK: extremities atraumatic, normal gait and station Skin: intact, no rashes on exposed skin, no jaundice, no cyanosis Psych: well-groomed, cooperative, good eye contact, euthymic mood, affect mood-congruent, speech is articulate, and thought processes clear and goal-directed    No results found for this or any previous visit (from the past 72 hour(s)). No results found.    Assessment and Plan: 47 y.o. female with   1. Encounter to establish care - reviewed PMH, PSH, PFH, medications and allergies - Mammogram and Pap smear UTD per patient, requesting records from Dr. Linda Hedges - Tdap and influenza UTD per patient - declines Pnuemovax - negative PHQ2  2. Sleeping difficulties - zolpidem (AMBIEN) 10 MG tablet; Take 1 tablet (10 mg total) by mouth at bedtime as needed for sleep.  Dispense: 30  tablet; Refill: 2  3. Exercise-induced bronchospasm - albuterol (PROVENTIL HFA;VENTOLIN HFA) 108 (90 Base) MCG/ACT inhaler; 1-2 puffs 30 minutes prior to exercise  Dispense: 2 Inhaler; Refill: 3  4. Mild intermittent asthma without complication - well-controlled without controller medication. No exacerbations in the last year. Cont Albuterol prn - albuterol (PROVENTIL HFA;VENTOLIN HFA) 108 (90 Base) MCG/ACT inhaler; 1-2 puffs 30 minutes prior to exercise  Dispense: 2 Inhaler; Refill: 3     Patient education and anticipatory guidance given Patient agrees with treatment plan Follow-up in 6 months for medication management or sooner as needed if symptoms worsen or fail to improve  Darlyne Russian PA-C

## 2017-06-16 ENCOUNTER — Encounter: Payer: Self-pay | Admitting: Physician Assistant

## 2017-08-09 MED FILL — ZOLPIDEM TARTRATE 10 MG TAB: 10 | 30 days supply | Qty: 30 | Fill #1

## 2017-09-01 MED FILL — TRI-LEGEST FE-28 DAY TABLET: 1-20/1-30/1 | 84 days supply | Qty: 84 | Fill #1

## 2017-09-16 DIAGNOSIS — D692 Other nonthrombocytopenic purpura: Secondary | ICD-10-CM | POA: Diagnosis not present

## 2017-09-16 DIAGNOSIS — D2239 Melanocytic nevi of other parts of face: Secondary | ICD-10-CM | POA: Diagnosis not present

## 2017-09-16 DIAGNOSIS — I788 Other diseases of capillaries: Secondary | ICD-10-CM | POA: Diagnosis not present

## 2017-09-16 DIAGNOSIS — D2272 Melanocytic nevi of left lower limb, including hip: Secondary | ICD-10-CM | POA: Diagnosis not present

## 2017-09-16 DIAGNOSIS — D225 Melanocytic nevi of trunk: Secondary | ICD-10-CM | POA: Diagnosis not present

## 2017-09-16 DIAGNOSIS — L814 Other melanin hyperpigmentation: Secondary | ICD-10-CM | POA: Diagnosis not present

## 2017-09-23 ENCOUNTER — Encounter: Payer: Self-pay | Admitting: Physician Assistant

## 2017-09-23 ENCOUNTER — Ambulatory Visit (INDEPENDENT_AMBULATORY_CARE_PROVIDER_SITE_OTHER): Payer: 59 | Admitting: Physician Assistant

## 2017-09-23 VITALS — BP 135/88 | HR 69 | Temp 98.1°F | Wt 141.0 lb

## 2017-09-23 DIAGNOSIS — J4521 Mild intermittent asthma with (acute) exacerbation: Secondary | ICD-10-CM | POA: Diagnosis not present

## 2017-09-23 MED ORDER — BENZONATATE 200 MG PO CAPS: 200.0000 mg | ORAL_CAPSULE | Freq: Every day | ORAL | 0 refills | Status: DC

## 2017-09-23 MED ORDER — PREDNISONE 50 MG PO TABS: 50.0000 mg | ORAL_TABLET | Freq: Every day | ORAL | 0 refills | Status: DC

## 2017-09-23 MED ORDER — AZITHROMYCIN 250 MG PO TABS: ORAL_TABLET | ORAL | 0 refills | Status: DC

## 2017-09-23 MED ORDER — IPRATROPIUM-ALBUTEROL 0.5-2.5 (3) MG/3ML IN SOLN: 3.0000 mL | RESPIRATORY_TRACT | 3 refills | Status: DC | PRN

## 2017-09-23 MED FILL — AZITHROMYCIN 250 MG TABLET: 250 | 5 days supply | Qty: 6 | Fill #0

## 2017-09-23 MED FILL — IPRAT-ALBUT 0.5-3(2.5) MG/3: 0.5-2.5 (3) | 60 days supply | Qty: 360 | Fill #0

## 2017-09-23 MED FILL — BENZONATATE 200 MG CAP: 200 | 20 days supply | Qty: 20 | Fill #0

## 2017-09-23 MED FILL — predniSONE 50 MG TABS: 50 | 5 days supply | Qty: 5 | Fill #0

## 2017-09-23 NOTE — Progress Notes (Signed)
HPI:                                                                Jennifer Carlson is a 47 y.o. female who presents to [facility]: [place] today for cough  Cough  This is a new problem. The current episode started in the past 7 days. The problem has been unchanged. The cough is non-productive. Associated symptoms include chest pain ("tightness"), a sore throat (intermittent) and shortness of breath. Pertinent negatives include no fever, nasal congestion or wheezing. Nothing aggravates the symptoms. She has tried a beta-agonist inhaler for the symptoms. The treatment provided moderate relief. Her past medical history is significant for asthma.      Depression screen [facility] 2/9 06/03/2017  Decreased Interest 0  Down, Depressed, Hopeless 0  PHQ - 2 Score 0    No flowsheet data found.    No past medical history on file. Past Surgical History:  Procedure Laterality Date  . CESAREAN SECTION     x 2 (2002, 2004)  . UMBILICAL HERNIA REPAIR     Social History   Tobacco Use  . Smoking status: Never Smoker  . Smokeless tobacco: Never Used  Substance Use Topics  . Alcohol use: Yes    Alcohol/week: 1.2 oz    Types: 2 Standard drinks or equivalent per week   family history includes Breast cancer in her paternal grandmother.    ROS: negative except as noted in the HPI  Medications: Current Outpatient Medications  Medication Sig Dispense Refill  . albuterol (PROVENTIL HFA;VENTOLIN HFA) 108 (90 Base) MCG/ACT inhaler 1-2 puffs 30 minutes prior to exercise 2 Inhaler 3  . docusate sodium (COLACE) 50 MG capsule Take 50 mg by mouth at bedtime.    . TRI-LEGEST FE 1-20/1-30/1-35 MG-MCG tablet Take 1 tablet by mouth daily.  3  . zolpidem (AMBIEN) 10 MG tablet Take 1 tablet (10 mg total) by mouth at bedtime as needed for sleep. 30 tablet 2   No current facility-administered medications for this visit.    Allergies  Allergen Reactions  . Augmentin  [Amoxicillin-Pot Clavulanate] Hives       Objective:  BP 135/88   Pulse 69   Temp 98.1 F (36.7 C) (Oral)   Wt 141 lb (64 kg)   SpO2 100%   BMI 26.64 kg/m  Gen:  alert, not ill-appearing, no distress, appropriate for age 7: head normocephalic without obvious abnormality, conjunctiva and cornea clear, oropharynx clear, neck supple, no cervical adenopathy, trachea midline Pulm: Normal work of breathing, normal phonation, clear to auscultation bilaterally, no wheezes, rales or rhonchi CV: Normal rate, regular rhythm, s1 and s2 distinct, no murmurs, clicks or rubs  Neuro: alert and oriented x 3, no tremor MSK: extremities atraumatic, normal gait and station Skin: intact, no rashes on exposed skin, no jaundice, no cyanosis Psych: well-groomed, cooperative, good eye contact, euthymic mood, affect mood-congruent, speech is articulate, and thought processes clear and goal-directed    No results found for this or any previous visit (from the past 72 hour(s)). No results found.    Assessment and Plan: 47 y.o. female with   Mild intermittent asthma with acute exacerbation - Plan: ipratropium-albuterol (DUONEB) 0.5-2.5 (3) MG/3ML SOLN, predniSONE (DELTASONE) 50 MG  tablet, azithromycin (ZITHROMAX Z-PAK) 250 MG tablet, benzonatate (TESSALON) 200 MG capsule - afebrile, no tachypnea, no tachycardia, SpO2 100% on RA at rest, no adventitious lung sounds. Treating for mild exacerbation with steroid burst and Azithromycin. Continue Albuterol 1-2 puffs every 4 hours as needed. She felt more relief with her daughter's nebulizer so I have provided refill of Duoneb and tubing as well.  Patient education and anticipatory guidance given Patient agrees with treatment plan Follow-up as needed if symptoms worsen or fail to improve  Darlyne Russian PA-C

## 2017-09-23 NOTE — Patient Instructions (Signed)
Bronchospasm, Adult Bronchospasm is a tightening of the airways going into the lungs. During an episode, it may be harder to breathe. You may cough, and you may make a whistling sound when you breathe (wheeze). This condition often affects people with asthma. What are the causes? This condition is caused by swelling and irritation in the airways. It can be triggered by:  An infection (common).  Seasonal allergies.  An allergic reaction.  Exercise.  Irritants. These include pollution, cigarette smoke, strong odors, aerosol sprays, and paint fumes.  Weather changes. Winds increase molds and pollens in the air. Cold air may cause swelling.  Stress and emotional upset.  What are the signs or symptoms? Symptoms of this condition include:  Wheezing. If the episode was triggered by an allergy, wheezing may start right away or hours later.  Nighttime coughing.  Frequent or severe coughing with a simple cold.  Chest tightness.  Shortness of breath.  Decreased ability to exercise.  How is this diagnosed? This condition is usually diagnosed with a review of your medical history and a physical exam. Tests, such as lung function tests, are sometimes done to look for other conditions. The need for a chest X-ray depends on where the wheezing occurs and whether it is the first time you have wheezed. How is this treated? This condition may be treated with:  Inhaled medicines. These open up the airways and help you breathe. They can be taken with an inhaler or a nebulizer device.  Corticosteroid medicines. These may be given for severe bronchospasm, usually when it is associated with asthma.  Avoiding triggers, such as irritants, infection, or allergies.  Follow these instructions at home: Medicines  Take over-the-counter and prescription medicines only as told by your health care provider.  If you need to use an inhaler or nebulizer to take your medicine, ask your health care  provider to explain how to use it correctly. If you were given a spacer, always use it with your inhaler. Lifestyle  Reduce the number of triggers in your home. To do this: ? Change your heating and air conditioning filter at least once a month. ? Limit your use of fireplaces and wood stoves. ? Do not smoke. Do not allow smoking in your home. ? Avoid using perfumes and fragrances. ? Get rid of pests, such as roaches and mice, and their droppings. ? Remove any mold from your home. ? Keep your house clean and dust free. Use unscented cleaning products. ? Replace carpet with wood, tile, or vinyl flooring. Carpet can trap dander and dust. ? Use allergy-proof pillows, mattress covers, and box spring covers. ? Wash bed sheets and blankets every week in hot water. Dry them in a dryer. ? Use blankets that are made of polyester or cotton. ? Wash your hands often. ? Do not allow pets in your bedroom.  Avoid breathing in cold air when you exercise. General instructions  Have a plan for seeking medical care. Know when to call your health care provider and local emergency services, and where to get emergency care.  Stay up to date on your immunizations.  When you have an episode of bronchospasm, stay calm. Try to relax and breathe more slowly.  If you have asthma, make sure you have an asthma action plan.  Keep all follow-up visits as told by your health care provider. This is important. Contact a health care provider if:  You have muscle aches.  You have chest pain.  The mucus that you   cough up (sputum) changes from clear or white to yellow, green, gray, or bloody.  You have a fever.  Your sputum gets thicker. Get help right away if:  Your wheezing and coughing get worse, even after you take your prescribed medicines.  It gets even harder to breathe.  You develop severe chest pain. Summary  Bronchospasm is a tightening of the airways going into the lungs.  During an episode of  bronchospasm, you may have a harder time breathing. You may cough and make a whistling sound when you breathe (wheeze).  Avoid exposure to triggers such as smoke, dust, mold, animal dander, and fragrances.  When you have an episode of bronchospasm, stay calm. Try to relax and breathe more slowly. This information is not intended to replace advice given to you by your health care provider. Make sure you discuss any questions you have with your health care provider. Document Released: 03/26/2003 Document Revised: 03/19/2016 Document Reviewed: 03/19/2016 Elsevier Interactive Patient Education  2017 [employer].  

## 2017-10-04 ENCOUNTER — Ambulatory Visit: Payer: 59 | Admitting: Family Medicine

## 2017-10-21 MED FILL — VENTOLIN HFA 90 MCG INHALER: 108 (90 BAS | 30 days supply | Qty: 18 | Fill #1

## 2017-11-08 MED FILL — ZOLPIDEM TARTRATE 10 MG TAB: 10 | 30 days supply | Qty: 30 | Fill #2

## 2017-11-24 MED FILL — TRI-LEGEST FE-28 DAY TABLET: 1-20/1-30/1 | 84 days supply | Qty: 84 | Fill #2

## 2017-12-24 MED FILL — VENTOLIN HFA 90 MCG INHALER: 108 (90 BAS | 30 days supply | Qty: 18 | Fill #2

## 2018-02-15 MED FILL — TRI-LEGEST FE-28 DAY TABLET: 1-20/1-30/1 | 84 days supply | Qty: 84 | Fill #3

## 2018-03-07 MED FILL — VENTOLIN HFA 90 MCG INHALER: 108 (90 BAS | 30 days supply | Qty: 18 | Fill #3

## 2018-05-11 MED FILL — TRI-LEGEST FE-28 DAY TABLET: 1-20/1-30/1 | 84 days supply | Qty: 84 | Fill #0 | Status: TO

## 2018-05-27 DIAGNOSIS — Z6826 Body mass index (BMI) 26.0-26.9, adult: Secondary | ICD-10-CM | POA: Diagnosis not present

## 2018-05-27 DIAGNOSIS — Z1231 Encounter for screening mammogram for malignant neoplasm of breast: Secondary | ICD-10-CM | POA: Diagnosis not present

## 2018-05-27 DIAGNOSIS — Z01419 Encounter for gynecological examination (general) (routine) without abnormal findings: Secondary | ICD-10-CM | POA: Diagnosis not present

## 2018-06-08 DIAGNOSIS — Z13228 Encounter for screening for other metabolic disorders: Secondary | ICD-10-CM | POA: Diagnosis not present

## 2018-06-08 DIAGNOSIS — Z13220 Encounter for screening for lipoid disorders: Secondary | ICD-10-CM | POA: Diagnosis not present

## 2018-07-16 MED FILL — TRI-LEGEST FE-28 DAY TABLET: 1-20/1-30/1 | 84 days supply | Qty: 84 | Fill #0

## 2018-10-26 MED FILL — TRI-LEGEST FE-28 DAY TABLET: 1-20/1-30/1 | 84 days supply | Qty: 84 | Fill #0

## 2019-01-19 MED FILL — TRI-LEGEST FE-28 DAY TABLET: 1-20/1-30/1 | 84 days supply | Qty: 84 | Fill #0

## 2019-04-10 MED FILL — TRI-LEGEST FE-28 DAY TABLET: 1-20/1-30/1 | 84 days supply | Qty: 84 | Fill #1

## 2019-05-15 DIAGNOSIS — H01114 Allergic dermatitis of left upper eyelid: Secondary | ICD-10-CM | POA: Diagnosis not present

## 2019-05-15 MED FILL — FLUOROMETHOLONE 0.1% DROPS: 0.1 | 25 days supply | Qty: 5 | Fill #0

## 2019-05-30 DIAGNOSIS — Z6826 Body mass index (BMI) 26.0-26.9, adult: Secondary | ICD-10-CM | POA: Diagnosis not present

## 2019-05-30 DIAGNOSIS — G4700 Insomnia, unspecified: Secondary | ICD-10-CM | POA: Diagnosis not present

## 2019-05-30 DIAGNOSIS — Z01419 Encounter for gynecological examination (general) (routine) without abnormal findings: Secondary | ICD-10-CM | POA: Diagnosis not present

## 2019-05-30 DIAGNOSIS — Z3040 Encounter for surveillance of contraceptives, unspecified: Secondary | ICD-10-CM | POA: Diagnosis not present

## 2019-05-30 DIAGNOSIS — J4520 Mild intermittent asthma, uncomplicated: Secondary | ICD-10-CM | POA: Diagnosis not present

## 2019-05-30 DIAGNOSIS — Z1231 Encounter for screening mammogram for malignant neoplasm of breast: Secondary | ICD-10-CM | POA: Diagnosis not present

## 2019-05-30 MED FILL — ZOLPIDEM TARTRATE 10 MG TAB: 10 | 30 days supply | Qty: 30 | Fill #0

## 2019-05-30 MED FILL — ALBUTEROL SULFATE HFA 108 (: 108 (90 BAS | 17 days supply | Qty: 18 | Fill #0

## 2019-07-04 MED FILL — TRI-LEGEST FE-28 DAY TABLET: 1-20/1-30/1 | 84 days supply | Qty: 84 | Fill #2

## 2019-09-19 MED FILL — TRI-LEGEST FE-28 DAY TABLET: 1-20/1-30/1 | 84 days supply | Qty: 84 | Fill #0

## 2019-12-15 MED FILL — TRI-LEGEST FE-28 DAY TABLET: 1-20/1-30/1 | 84 days supply | Qty: 84 | Fill #1

## 2019-12-19 MED FILL — ALBUTEROL SULFATE HFA 108 (: 108 (90 BAS | 17 days supply | Qty: 18 | Fill #1

## 2020-03-13 MED FILL — TRI-LEGEST FE-28 DAY TABLET: 1-20/1-30/1 | 84 days supply | Qty: 84 | Fill #2

## 2020-04-04 MED FILL — ALBUTEROL SULFATE HFA 108 (: 108 (90 BAS | 17 days supply | Qty: 18 | Fill #2

## 2020-06-03 ENCOUNTER — Other Ambulatory Visit (HOSPITAL_BASED_OUTPATIENT_CLINIC_OR_DEPARTMENT_OTHER): Payer: Self-pay | Admitting: Radiology

## 2020-06-03 MED FILL — TRI-LEGEST FE-28 DAY TABLET: 1-20/1-30/1 | 84 days supply | Qty: 84 | Fill #0

## 2020-06-17 ENCOUNTER — Other Ambulatory Visit (HOSPITAL_BASED_OUTPATIENT_CLINIC_OR_DEPARTMENT_OTHER): Payer: Self-pay | Admitting: Radiology

## 2020-06-17 DIAGNOSIS — Z6826 Body mass index (BMI) 26.0-26.9, adult: Secondary | ICD-10-CM | POA: Diagnosis not present

## 2020-06-17 DIAGNOSIS — Z1329 Encounter for screening for other suspected endocrine disorder: Secondary | ICD-10-CM | POA: Diagnosis not present

## 2020-06-17 DIAGNOSIS — G4700 Insomnia, unspecified: Secondary | ICD-10-CM | POA: Diagnosis not present

## 2020-06-17 DIAGNOSIS — Z13220 Encounter for screening for lipoid disorders: Secondary | ICD-10-CM | POA: Diagnosis not present

## 2020-06-17 DIAGNOSIS — Z13228 Encounter for screening for other metabolic disorders: Secondary | ICD-10-CM | POA: Diagnosis not present

## 2020-06-17 DIAGNOSIS — Z3040 Encounter for surveillance of contraceptives, unspecified: Secondary | ICD-10-CM | POA: Diagnosis not present

## 2020-06-17 DIAGNOSIS — Z01419 Encounter for gynecological examination (general) (routine) without abnormal findings: Secondary | ICD-10-CM | POA: Diagnosis not present

## 2020-06-17 DIAGNOSIS — Z1321 Encounter for screening for nutritional disorder: Secondary | ICD-10-CM | POA: Diagnosis not present

## 2020-06-17 DIAGNOSIS — Z1231 Encounter for screening mammogram for malignant neoplasm of breast: Secondary | ICD-10-CM | POA: Diagnosis not present

## 2020-06-18 ENCOUNTER — Other Ambulatory Visit (HOSPITAL_BASED_OUTPATIENT_CLINIC_OR_DEPARTMENT_OTHER): Payer: Self-pay | Admitting: Radiology

## 2020-06-28 ENCOUNTER — Encounter: Payer: Self-pay | Admitting: Internal Medicine

## 2020-07-15 ENCOUNTER — Other Ambulatory Visit (HOSPITAL_BASED_OUTPATIENT_CLINIC_OR_DEPARTMENT_OTHER): Payer: Self-pay

## 2020-07-15 MED FILL — Thyroid Tab 30 MG (1/2 Grain): ORAL | 30 days supply | Qty: 30 | Fill #0 | Status: AC

## 2020-07-15 MED FILL — Norethindrone Ac-Ethinyl Estrad-Fe Tab 1-20/1-30/1-35 MG-MCG: ORAL | 84 days supply | Qty: 84 | Fill #0 | Status: CN

## 2020-07-15 MED FILL — Zolpidem Tartrate Tab 10 MG: ORAL | 30 days supply | Qty: 30 | Fill #0 | Status: AC

## 2020-07-16 ENCOUNTER — Other Ambulatory Visit (HOSPITAL_BASED_OUTPATIENT_CLINIC_OR_DEPARTMENT_OTHER): Payer: Self-pay

## 2020-07-16 MED ORDER — ALBUTEROL SULFATE HFA 108 (90 BASE) MCG/ACT IN AERS
INHALATION_SPRAY | RESPIRATORY_TRACT | 3 refills | Status: AC
  Filled 2020-07-16: qty 18, 30d supply, fill #0
  Filled 2021-02-16: qty 18, 30d supply, fill #1

## 2020-07-31 ENCOUNTER — Other Ambulatory Visit (HOSPITAL_BASED_OUTPATIENT_CLINIC_OR_DEPARTMENT_OTHER): Payer: Self-pay

## 2020-08-14 ENCOUNTER — Other Ambulatory Visit (HOSPITAL_BASED_OUTPATIENT_CLINIC_OR_DEPARTMENT_OTHER): Payer: Self-pay

## 2020-08-14 MED FILL — Thyroid Tab 30 MG (1/2 Grain): ORAL | 30 days supply | Qty: 30 | Fill #1 | Status: AC

## 2020-08-21 DIAGNOSIS — Z135 Encounter for screening for eye and ear disorders: Secondary | ICD-10-CM | POA: Diagnosis not present

## 2020-08-21 DIAGNOSIS — H52223 Regular astigmatism, bilateral: Secondary | ICD-10-CM | POA: Diagnosis not present

## 2020-08-21 DIAGNOSIS — H5213 Myopia, bilateral: Secondary | ICD-10-CM | POA: Diagnosis not present

## 2020-08-26 ENCOUNTER — Other Ambulatory Visit: Payer: Self-pay

## 2020-08-27 ENCOUNTER — Other Ambulatory Visit (HOSPITAL_BASED_OUTPATIENT_CLINIC_OR_DEPARTMENT_OTHER): Payer: Self-pay

## 2020-08-27 MED ORDER — TILIA FE 1-20/1-30/1-35 MG-MCG PO TABS
ORAL_TABLET | ORAL | 3 refills | Status: DC
  Filled 2020-08-27: qty 84, 84d supply, fill #0
  Filled 2020-11-17: qty 84, 84d supply, fill #1
  Filled 2021-02-11: qty 84, 84d supply, fill #2
  Filled 2021-04-28: qty 84, 84d supply, fill #3

## 2020-08-28 ENCOUNTER — Other Ambulatory Visit (HOSPITAL_BASED_OUTPATIENT_CLINIC_OR_DEPARTMENT_OTHER): Payer: Self-pay

## 2020-09-05 ENCOUNTER — Ambulatory Visit (AMBULATORY_SURGERY_CENTER): Payer: 59 | Admitting: *Deleted

## 2020-09-05 ENCOUNTER — Other Ambulatory Visit: Payer: Self-pay

## 2020-09-05 VITALS — Ht 61.0 in | Wt 138.0 lb

## 2020-09-05 DIAGNOSIS — Z1211 Encounter for screening for malignant neoplasm of colon: Secondary | ICD-10-CM

## 2020-09-05 NOTE — Progress Notes (Signed)
  No trouble with anesthesia, denies difficulty moving neck, or hx/fam hx of malignant hyperthermia per pt   No egg or soy allergy  No home oxygen use   No medications for weight loss taken  emmi information given  Pt states, "I have an issue- I can only have a BM at home.  I work 12 hour shifts, so sometimes I need Colace to keep me regular."  I encouraged her to continue her Colace.  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

## 2020-09-12 DIAGNOSIS — E039 Hypothyroidism, unspecified: Secondary | ICD-10-CM | POA: Diagnosis not present

## 2020-09-13 ENCOUNTER — Other Ambulatory Visit (HOSPITAL_BASED_OUTPATIENT_CLINIC_OR_DEPARTMENT_OTHER): Payer: Self-pay

## 2020-09-13 ENCOUNTER — Telehealth: Payer: Self-pay | Admitting: Internal Medicine

## 2020-09-13 DIAGNOSIS — Z1211 Encounter for screening for malignant neoplasm of colon: Secondary | ICD-10-CM

## 2020-09-13 MED ORDER — SUPREP BOWEL PREP KIT 17.5-3.13-1.6 GM/177ML PO SOLN
1.0000 | Freq: Once | ORAL | 0 refills | Status: AC
  Filled 2020-09-13: qty 354, 1d supply, fill #0

## 2020-09-13 NOTE — Telephone Encounter (Signed)
Spoke with pt- Suprep resent to [facility]

## 2020-09-13 NOTE — Telephone Encounter (Signed)
Patient called states she previously called to advise the [facility] has not received any prep medications.

## 2020-09-16 ENCOUNTER — Other Ambulatory Visit (HOSPITAL_BASED_OUTPATIENT_CLINIC_OR_DEPARTMENT_OTHER): Payer: Self-pay

## 2020-09-16 MED FILL — Thyroid Tab 30 MG (1/2 Grain): ORAL | 30 days supply | Qty: 30 | Fill #2 | Status: AC

## 2020-09-18 ENCOUNTER — Encounter: Payer: Self-pay | Admitting: Certified Registered Nurse Anesthetist

## 2020-09-18 ENCOUNTER — Encounter: Payer: Self-pay | Admitting: Internal Medicine

## 2020-09-19 ENCOUNTER — Ambulatory Visit (AMBULATORY_SURGERY_CENTER): Payer: 59 | Admitting: Internal Medicine

## 2020-09-19 ENCOUNTER — Encounter: Payer: Self-pay | Admitting: Internal Medicine

## 2020-09-19 ENCOUNTER — Other Ambulatory Visit: Payer: Self-pay

## 2020-09-19 VITALS — BP 131/83 | HR 73 | Temp 97.8°F | Resp 23 | Ht 61.0 in | Wt 138.0 lb

## 2020-09-19 DIAGNOSIS — Z1211 Encounter for screening for malignant neoplasm of colon: Secondary | ICD-10-CM

## 2020-09-19 DIAGNOSIS — J45909 Unspecified asthma, uncomplicated: Secondary | ICD-10-CM | POA: Diagnosis not present

## 2020-09-19 MED ORDER — SODIUM CHLORIDE 0.9 % IV SOLN: 500.0000 mL | Freq: Once | INTRAVENOUS | Status: DC

## 2020-09-19 NOTE — Progress Notes (Signed)
Pt's states no medical or surgical changes since previsit or office visit. 

## 2020-09-19 NOTE — Patient Instructions (Addendum)
HANDOUTS PROVIDED:  HEMORRHOIDS  Your hemorrhoids were swollen which is a common finding after a prep - but NO polyps or cancer!  Next routine colonoscopy or other screening test in 10 years - 2032.  I appreciate the opportunity to care for you. Gatha Mayer, MD, FACG  YOU HAD AN ENDOSCOPIC PROCEDURE TODAY AT [facility]:   Refer to the procedure report that was given to you for any specific questions about what was found during the examination.  If the procedure report does not answer your questions, please call your gastroenterologist to clarify.  If you requested that your care partner not be given the details of your procedure findings, then the procedure report has been included in a sealed envelope for you to review at your convenience later.  YOU SHOULD EXPECT: Some feelings of bloating in the abdomen. Passage of more gas than usual.  Walking can help get rid of the air that was put into your GI tract during the procedure and reduce the bloating. If you had a lower endoscopy (such as a colonoscopy or flexible sigmoidoscopy) you may notice spotting of blood in your stool or on the toilet paper. If you underwent a bowel prep for your procedure, you may not have a normal bowel movement for a few days.  Please Note:  You might notice some irritation and congestion in your nose or some drainage.  This is from the oxygen used during your procedure.  There is no need for concern and it should clear up in a day or so.  SYMPTOMS TO REPORT IMMEDIATELY:  Following lower endoscopy (colonoscopy or flexible sigmoidoscopy):  Excessive amounts of blood in the stool  Significant tenderness or worsening of abdominal pains  Swelling of the abdomen that is new, acute  Fever of 100F or higher  For urgent or emergent issues, a gastroenterologist can be reached at any hour by calling [phone]. Do not use MyChart messaging for urgent concerns.    DIET:  We do recommend a small  meal at first, but then you may proceed to your regular diet.  Drink plenty of fluids but you should avoid alcoholic beverages for 24 hours.  ACTIVITY:  You should plan to take it easy for the rest of today and you should NOT DRIVE or use heavy machinery until tomorrow (because of the sedation medicines used during the test).    FOLLOW UP: Our staff will call the number listed on your records Monday morning between 7:15 am and 8:15 am following your procedure to check on you and address any questions or concerns that you may have regarding the information given to you following your procedure. If we do not reach you, we will leave a message.  We will attempt to reach you two times.  During this call, we will ask if you have developed any symptoms of COVID 19. If you develop any symptoms (ie: fever, flu-like symptoms, shortness of breath, cough etc.) before then, please call [phone].  If you test positive for Covid 19 in the 2 weeks post procedure, please call and report this information to [place].    If any biopsies were taken you will be contacted by phone or by letter within the next 1-3 weeks.  Please call us at [phone] if you have not heard about the biopsies in 3 weeks.    SIGNATURES/CONFIDENTIALITY: You and/or your care partner have signed paperwork which will be entered into your electronic medical record.  These  signatures attest to the fact that that the information above on your After Visit Summary has been reviewed and is understood.  Full responsibility of the confidentiality of this discharge information lies with you and/or your care-partner.

## 2020-09-19 NOTE — Op Note (Signed)
[facility] Patient Name: Jennifer Carlson Procedure Date: 09/19/2020 7:52 AM MRN: [id number] Endoscopist: Gatha Mayer , MD Age: 50 Referring MD:  Date of Birth: Dec 23, 1970 Gender: Female Account #: [id number] Procedure:                Colonoscopy Indications:              Screening for colorectal malignant neoplasm, This                            is the patient's first colonoscopy Medicines:                Propofol per Anesthesia, Monitored Anesthesia Care Procedure:                Pre-Anesthesia Assessment:                           - Prior to the procedure, a History and Physical                            was performed, and patient medications and                            allergies were reviewed. The patient's tolerance of                            previous anesthesia was also reviewed. The risks                            and benefits of the procedure and the sedation                            options and risks were discussed with the patient.                            All questions were answered, and informed consent                            was obtained. Prior Anticoagulants: The patient has                            taken no previous anticoagulant or antiplatelet                            agents. ASA Grade Assessment: II - A patient with                            mild systemic disease. After reviewing the risks                            and benefits, the patient was deemed in                            satisfactory condition to undergo the procedure.  After obtaining informed consent, the colonoscope                            was passed under direct vision. Throughout the                            procedure, the patient's blood pressure, pulse, and                            oxygen saturations were monitored continuously. The                            Olympus CF-HQ190 [id number]) Colonoscope was                             introduced through the anus and advanced to the the                            cecum, identified by appendiceal orifice and                            ileocecal valve. The colonoscopy was performed                            without difficulty. The patient tolerated the                            procedure well. The quality of the bowel                            preparation was excellent. The ileocecal valve,                            appendiceal orifice, and rectum were photographed.                            The bowel preparation used was SUPREP via split                            dose instruction. Scope In: 8:03:11 AM Scope Out: 8:13:14 AM Scope Withdrawal Time: 0 hours 6 minutes 18 seconds  Total Procedure Duration: 0 hours 10 minutes 3 seconds  Findings:                 The perianal and digital rectal examinations were                            normal.                           External and internal hemorrhoids were found.                           The exam was otherwise without abnormality on  direct and retroflexion views. Complications:            No immediate complications. Estimated Blood Loss:     Estimated blood loss: none. Impression:               - External and internal hemorrhoids.                           - The examination was otherwise normal on direct                            and retroflexion views.                           - No specimens collected. Recommendation:           - Patient has a contact number available for                            emergencies. The signs and symptoms of potential                            delayed complications were discussed with the                            patient. Return to normal activities tomorrow.                            Written discharge instructions were provided to the                            patient.                           - Resume previous diet.                           -  Continue present medications.                           - Repeat colonoscopy/other test in 10 years for                            screening purposes. Gatha Mayer, MD 09/19/2020 8:20:08 AM This report has been signed electronically.

## 2020-09-19 NOTE — Progress Notes (Signed)
Report given to PACU, vss 

## 2020-09-23 ENCOUNTER — Telehealth: Payer: Self-pay | Admitting: *Deleted

## 2020-09-23 NOTE — Telephone Encounter (Signed)
  Follow up Call-  Call back number 09/19/2020  Post procedure Call Back phone  # [phone]  Permission to leave phone message Yes  Some recent data might be hidden     No answer at 2nd attempt follow up phone call.  Left message on voicemail.

## 2020-09-23 NOTE — Telephone Encounter (Signed)
Left message on f/u call 

## 2020-10-18 ENCOUNTER — Other Ambulatory Visit (HOSPITAL_BASED_OUTPATIENT_CLINIC_OR_DEPARTMENT_OTHER): Payer: Self-pay

## 2020-10-18 MED FILL — Thyroid Tab 30 MG (1/2 Grain): ORAL | 30 days supply | Qty: 30 | Fill #3 | Status: CN

## 2020-10-21 ENCOUNTER — Other Ambulatory Visit (HOSPITAL_BASED_OUTPATIENT_CLINIC_OR_DEPARTMENT_OTHER): Payer: Self-pay

## 2020-10-21 MED FILL — Thyroid Tab 30 MG (1/2 Grain): ORAL | 30 days supply | Qty: 30 | Fill #3 | Status: CN

## 2020-10-22 ENCOUNTER — Other Ambulatory Visit (HOSPITAL_BASED_OUTPATIENT_CLINIC_OR_DEPARTMENT_OTHER): Payer: Self-pay

## 2020-10-23 ENCOUNTER — Other Ambulatory Visit (HOSPITAL_BASED_OUTPATIENT_CLINIC_OR_DEPARTMENT_OTHER): Payer: Self-pay

## 2020-10-23 MED ORDER — LEVOTHYROXINE SODIUM 50 MCG PO TABS
ORAL_TABLET | ORAL | 7 refills | Status: DC
  Filled 2020-10-23: qty 30, 30d supply, fill #0
  Filled 2020-11-20: qty 30, 30d supply, fill #1
  Filled 2020-12-22: qty 30, 30d supply, fill #2
  Filled 2021-01-23: qty 30, 30d supply, fill #3
  Filled 2021-02-23: qty 30, 30d supply, fill #4
  Filled 2021-03-27: qty 30, 30d supply, fill #5
  Filled 2021-04-28: qty 30, 30d supply, fill #6
  Filled 2021-05-26: qty 30, 30d supply, fill #7

## 2020-10-24 ENCOUNTER — Other Ambulatory Visit (HOSPITAL_BASED_OUTPATIENT_CLINIC_OR_DEPARTMENT_OTHER): Payer: Self-pay

## 2020-10-25 ENCOUNTER — Other Ambulatory Visit (HOSPITAL_BASED_OUTPATIENT_CLINIC_OR_DEPARTMENT_OTHER): Payer: Self-pay

## 2020-10-28 ENCOUNTER — Other Ambulatory Visit (HOSPITAL_BASED_OUTPATIENT_CLINIC_OR_DEPARTMENT_OTHER): Payer: Self-pay

## 2020-11-04 ENCOUNTER — Other Ambulatory Visit (HOSPITAL_BASED_OUTPATIENT_CLINIC_OR_DEPARTMENT_OTHER): Payer: Self-pay

## 2020-11-05 ENCOUNTER — Other Ambulatory Visit (HOSPITAL_BASED_OUTPATIENT_CLINIC_OR_DEPARTMENT_OTHER): Payer: Self-pay

## 2020-11-18 ENCOUNTER — Other Ambulatory Visit (HOSPITAL_BASED_OUTPATIENT_CLINIC_OR_DEPARTMENT_OTHER): Payer: Self-pay

## 2020-11-21 ENCOUNTER — Other Ambulatory Visit (HOSPITAL_BASED_OUTPATIENT_CLINIC_OR_DEPARTMENT_OTHER): Payer: Self-pay

## 2020-12-23 ENCOUNTER — Other Ambulatory Visit (HOSPITAL_BASED_OUTPATIENT_CLINIC_OR_DEPARTMENT_OTHER): Payer: Self-pay

## 2021-01-01 ENCOUNTER — Other Ambulatory Visit (HOSPITAL_BASED_OUTPATIENT_CLINIC_OR_DEPARTMENT_OTHER): Payer: Self-pay

## 2021-01-02 ENCOUNTER — Other Ambulatory Visit (HOSPITAL_BASED_OUTPATIENT_CLINIC_OR_DEPARTMENT_OTHER): Payer: Self-pay

## 2021-01-02 MED ORDER — ZOLPIDEM TARTRATE 10 MG PO TABS
10.0000 mg | ORAL_TABLET | Freq: Every day | ORAL | 0 refills | Status: DC
  Filled 2021-01-02: qty 30, 30d supply, fill #0

## 2021-01-09 ENCOUNTER — Other Ambulatory Visit (HOSPITAL_BASED_OUTPATIENT_CLINIC_OR_DEPARTMENT_OTHER): Payer: Self-pay

## 2021-01-23 ENCOUNTER — Other Ambulatory Visit (HOSPITAL_BASED_OUTPATIENT_CLINIC_OR_DEPARTMENT_OTHER): Payer: Self-pay

## 2021-02-05 ENCOUNTER — Other Ambulatory Visit (HOSPITAL_BASED_OUTPATIENT_CLINIC_OR_DEPARTMENT_OTHER): Payer: Self-pay

## 2021-02-05 DIAGNOSIS — L308 Other specified dermatitis: Secondary | ICD-10-CM | POA: Diagnosis not present

## 2021-02-05 MED ORDER — TRIAMCINOLONE ACETONIDE 0.1 % EX CREA
TOPICAL_CREAM | CUTANEOUS | 0 refills | Status: DC
  Filled 2021-02-05 (×2): qty 454, 30d supply, fill #0

## 2021-02-12 ENCOUNTER — Other Ambulatory Visit (HOSPITAL_BASED_OUTPATIENT_CLINIC_OR_DEPARTMENT_OTHER): Payer: Self-pay

## 2021-02-13 ENCOUNTER — Other Ambulatory Visit (HOSPITAL_BASED_OUTPATIENT_CLINIC_OR_DEPARTMENT_OTHER): Payer: Self-pay

## 2021-02-17 ENCOUNTER — Other Ambulatory Visit (HOSPITAL_BASED_OUTPATIENT_CLINIC_OR_DEPARTMENT_OTHER): Payer: Self-pay

## 2021-02-24 ENCOUNTER — Other Ambulatory Visit (HOSPITAL_BASED_OUTPATIENT_CLINIC_OR_DEPARTMENT_OTHER): Payer: Self-pay

## 2021-03-27 ENCOUNTER — Other Ambulatory Visit (HOSPITAL_BASED_OUTPATIENT_CLINIC_OR_DEPARTMENT_OTHER): Payer: Self-pay

## 2021-04-03 ENCOUNTER — Other Ambulatory Visit (HOSPITAL_BASED_OUTPATIENT_CLINIC_OR_DEPARTMENT_OTHER): Payer: Self-pay

## 2021-04-03 MED ORDER — ZOLPIDEM TARTRATE 10 MG PO TABS
10.0000 mg | ORAL_TABLET | Freq: Every day | ORAL | 0 refills | Status: DC | PRN
  Filled 2021-04-03: qty 30, 30d supply, fill #0

## 2021-04-28 ENCOUNTER — Other Ambulatory Visit (HOSPITAL_BASED_OUTPATIENT_CLINIC_OR_DEPARTMENT_OTHER): Payer: Self-pay

## 2021-05-05 ENCOUNTER — Other Ambulatory Visit (HOSPITAL_BASED_OUTPATIENT_CLINIC_OR_DEPARTMENT_OTHER): Payer: Self-pay

## 2021-05-26 ENCOUNTER — Other Ambulatory Visit (HOSPITAL_BASED_OUTPATIENT_CLINIC_OR_DEPARTMENT_OTHER): Payer: Self-pay

## 2021-05-27 ENCOUNTER — Other Ambulatory Visit (HOSPITAL_BASED_OUTPATIENT_CLINIC_OR_DEPARTMENT_OTHER): Payer: Self-pay

## 2021-06-18 DIAGNOSIS — Z1329 Encounter for screening for other suspected endocrine disorder: Secondary | ICD-10-CM | POA: Diagnosis not present

## 2021-06-18 DIAGNOSIS — Z01419 Encounter for gynecological examination (general) (routine) without abnormal findings: Secondary | ICD-10-CM | POA: Diagnosis not present

## 2021-06-18 DIAGNOSIS — Z130 Encounter for screening for diseases of the blood and blood-forming organs and certain disorders involving the immune mechanism: Secondary | ICD-10-CM | POA: Diagnosis not present

## 2021-06-18 DIAGNOSIS — Z1151 Encounter for screening for human papillomavirus (HPV): Secondary | ICD-10-CM | POA: Diagnosis not present

## 2021-06-18 DIAGNOSIS — Z6826 Body mass index (BMI) 26.0-26.9, adult: Secondary | ICD-10-CM | POA: Diagnosis not present

## 2021-06-18 DIAGNOSIS — Z13228 Encounter for screening for other metabolic disorders: Secondary | ICD-10-CM | POA: Diagnosis not present

## 2021-06-18 DIAGNOSIS — Z124 Encounter for screening for malignant neoplasm of cervix: Secondary | ICD-10-CM | POA: Diagnosis not present

## 2021-06-18 DIAGNOSIS — Z131 Encounter for screening for diabetes mellitus: Secondary | ICD-10-CM | POA: Diagnosis not present

## 2021-06-18 DIAGNOSIS — Z1231 Encounter for screening mammogram for malignant neoplasm of breast: Secondary | ICD-10-CM | POA: Diagnosis not present

## 2021-06-18 DIAGNOSIS — Z13220 Encounter for screening for lipoid disorders: Secondary | ICD-10-CM | POA: Diagnosis not present

## 2021-06-19 ENCOUNTER — Other Ambulatory Visit (HOSPITAL_BASED_OUTPATIENT_CLINIC_OR_DEPARTMENT_OTHER): Payer: Self-pay

## 2021-06-19 MED ORDER — ZOLPIDEM TARTRATE 10 MG PO TABS
10.0000 mg | ORAL_TABLET | Freq: Every day | ORAL | 0 refills | Status: DC | PRN
  Filled 2021-06-19: qty 30, 30d supply, fill #0

## 2021-06-30 ENCOUNTER — Other Ambulatory Visit (HOSPITAL_BASED_OUTPATIENT_CLINIC_OR_DEPARTMENT_OTHER): Payer: Self-pay

## 2021-07-01 ENCOUNTER — Other Ambulatory Visit (HOSPITAL_BASED_OUTPATIENT_CLINIC_OR_DEPARTMENT_OTHER): Payer: Self-pay

## 2021-07-01 MED ORDER — LEVOTHYROXINE SODIUM 50 MCG PO TABS
ORAL_TABLET | ORAL | 3 refills | Status: DC
  Filled 2021-07-01: qty 90, 90d supply, fill #0
  Filled 2021-09-30 (×2): qty 90, 90d supply, fill #1
  Filled 2022-01-03: qty 90, 90d supply, fill #2
  Filled 2022-04-04: qty 90, 90d supply, fill #3

## 2021-07-04 ENCOUNTER — Other Ambulatory Visit (HOSPITAL_BASED_OUTPATIENT_CLINIC_OR_DEPARTMENT_OTHER): Payer: Self-pay

## 2021-07-29 ENCOUNTER — Other Ambulatory Visit (HOSPITAL_BASED_OUTPATIENT_CLINIC_OR_DEPARTMENT_OTHER): Payer: Self-pay

## 2021-07-30 ENCOUNTER — Other Ambulatory Visit (HOSPITAL_BASED_OUTPATIENT_CLINIC_OR_DEPARTMENT_OTHER): Payer: Self-pay

## 2021-07-30 MED ORDER — NORETHINDRON-ETHINYL ESTRAD-FE 1-20/1-30/1-35 MG-MCG PO TABS
ORAL_TABLET | ORAL | 4 refills | Status: DC
  Filled 2021-07-30: qty 84, 84d supply, fill #0
  Filled 2021-10-20: qty 84, 84d supply, fill #1
  Filled 2022-01-03: qty 84, 84d supply, fill #2
  Filled 2022-04-04: qty 84, 84d supply, fill #3

## 2021-08-08 ENCOUNTER — Other Ambulatory Visit (HOSPITAL_BASED_OUTPATIENT_CLINIC_OR_DEPARTMENT_OTHER): Payer: Self-pay

## 2021-08-24 ENCOUNTER — Other Ambulatory Visit (HOSPITAL_BASED_OUTPATIENT_CLINIC_OR_DEPARTMENT_OTHER): Payer: Self-pay

## 2021-08-25 ENCOUNTER — Other Ambulatory Visit (HOSPITAL_BASED_OUTPATIENT_CLINIC_OR_DEPARTMENT_OTHER): Payer: Self-pay

## 2021-08-25 MED ORDER — ZOLPIDEM TARTRATE 10 MG PO TABS
10.0000 mg | ORAL_TABLET | Freq: Every day | ORAL | 0 refills | Status: DC | PRN
  Filled 2021-08-25: qty 30, 30d supply, fill #0

## 2021-09-26 ENCOUNTER — Other Ambulatory Visit (HOSPITAL_BASED_OUTPATIENT_CLINIC_OR_DEPARTMENT_OTHER): Payer: Self-pay

## 2021-09-30 ENCOUNTER — Other Ambulatory Visit (HOSPITAL_BASED_OUTPATIENT_CLINIC_OR_DEPARTMENT_OTHER): Payer: Self-pay

## 2021-09-30 DIAGNOSIS — H1033 Unspecified acute conjunctivitis, bilateral: Secondary | ICD-10-CM | POA: Diagnosis not present

## 2021-09-30 MED ORDER — FLUOROMETHOLONE 0.1 % OP SUSP
OPHTHALMIC | 0 refills | Status: DC
  Filled 2021-09-30: qty 5, 13d supply, fill #0

## 2021-10-08 ENCOUNTER — Other Ambulatory Visit (HOSPITAL_BASED_OUTPATIENT_CLINIC_OR_DEPARTMENT_OTHER): Payer: Self-pay

## 2021-10-08 DIAGNOSIS — L218 Other seborrheic dermatitis: Secondary | ICD-10-CM | POA: Diagnosis not present

## 2021-10-08 DIAGNOSIS — D2372 Other benign neoplasm of skin of left lower limb, including hip: Secondary | ICD-10-CM | POA: Diagnosis not present

## 2021-10-08 DIAGNOSIS — D2362 Other benign neoplasm of skin of left upper limb, including shoulder: Secondary | ICD-10-CM | POA: Diagnosis not present

## 2021-10-08 DIAGNOSIS — L578 Other skin changes due to chronic exposure to nonionizing radiation: Secondary | ICD-10-CM | POA: Diagnosis not present

## 2021-10-08 DIAGNOSIS — L570 Actinic keratosis: Secondary | ICD-10-CM | POA: Diagnosis not present

## 2021-10-08 DIAGNOSIS — D1801 Hemangioma of skin and subcutaneous tissue: Secondary | ICD-10-CM | POA: Diagnosis not present

## 2021-10-08 DIAGNOSIS — D225 Melanocytic nevi of trunk: Secondary | ICD-10-CM | POA: Diagnosis not present

## 2021-10-08 DIAGNOSIS — D2272 Melanocytic nevi of left lower limb, including hip: Secondary | ICD-10-CM | POA: Diagnosis not present

## 2021-10-08 DIAGNOSIS — L814 Other melanin hyperpigmentation: Secondary | ICD-10-CM | POA: Diagnosis not present

## 2021-10-08 MED ORDER — FLUOROURACIL 5 % EX CREA
TOPICAL_CREAM | CUTANEOUS | 0 refills | Status: DC
  Filled 2022-03-17: qty 40, 14d supply, fill #0

## 2021-10-20 ENCOUNTER — Other Ambulatory Visit (HOSPITAL_BASED_OUTPATIENT_CLINIC_OR_DEPARTMENT_OTHER): Payer: Self-pay

## 2021-11-03 ENCOUNTER — Other Ambulatory Visit (HOSPITAL_BASED_OUTPATIENT_CLINIC_OR_DEPARTMENT_OTHER): Payer: Self-pay

## 2021-11-05 ENCOUNTER — Other Ambulatory Visit (HOSPITAL_BASED_OUTPATIENT_CLINIC_OR_DEPARTMENT_OTHER): Payer: Self-pay

## 2021-11-05 MED ORDER — ZOLPIDEM TARTRATE 10 MG PO TABS
10.0000 mg | ORAL_TABLET | Freq: Every day | ORAL | 0 refills | Status: DC | PRN
  Filled 2021-11-05: qty 30, 30d supply, fill #0

## 2021-11-07 ENCOUNTER — Other Ambulatory Visit (HOSPITAL_BASED_OUTPATIENT_CLINIC_OR_DEPARTMENT_OTHER): Payer: Self-pay

## 2021-11-18 ENCOUNTER — Telehealth: Payer: Self-pay | Admitting: Internal Medicine

## 2021-11-18 ENCOUNTER — Encounter: Payer: Self-pay | Admitting: Internal Medicine

## 2021-11-18 NOTE — Telephone Encounter (Signed)
Pt states that her sister last month was diagnosed with stage 2 colon cancer and had to have a colon resection: Pt stated that she currently has a 10 year recall and questions if this should be done sooner due to sister recently diagnosed:  please advise

## 2021-11-18 NOTE — Telephone Encounter (Signed)
PT Called to check when she was due for another colonoscopy. Advised it would be in 2032. Just recently found out her sister had polyps and is very concerned if she needs to have an earlier procedure. Please advise. Thank you.

## 2021-11-18 NOTE — Telephone Encounter (Signed)
My apologies for sending to Dr. Havery Moros. That was a mistake

## 2021-11-18 NOTE — Telephone Encounter (Addendum)
As long as sister was not older than 13 I would recommend a 5-year recall instead so we should change that.  Also change health maintenance section to colonoscopy every 5 years.  If her sister is older than 69 please let me know how old she was as we might still do it at 5 years

## 2021-11-19 NOTE — Telephone Encounter (Signed)
Left message for pt to call back  °

## 2021-11-20 ENCOUNTER — Telehealth: Payer: Self-pay | Admitting: Internal Medicine

## 2021-11-20 NOTE — Telephone Encounter (Signed)
Left message for pt to call back  °

## 2021-11-20 NOTE — Telephone Encounter (Signed)
Pt made aware of Dr. Celesta Aver  recommendations:  Pt stated that her sister was 49 years old Pt recall was changed to 5 year: September 04 2025 Also change health maintenance section to colonoscopy every 5 years. Pt verbalized understanding with all questions answered.

## 2021-11-20 NOTE — Telephone Encounter (Signed)
Patient returned your call, please call to advise.

## 2021-12-16 DIAGNOSIS — H52222 Regular astigmatism, left eye: Secondary | ICD-10-CM | POA: Diagnosis not present

## 2021-12-16 DIAGNOSIS — H5201 Hypermetropia, right eye: Secondary | ICD-10-CM | POA: Diagnosis not present

## 2022-01-03 ENCOUNTER — Other Ambulatory Visit (HOSPITAL_BASED_OUTPATIENT_CLINIC_OR_DEPARTMENT_OTHER): Payer: Self-pay

## 2022-01-05 ENCOUNTER — Other Ambulatory Visit (HOSPITAL_BASED_OUTPATIENT_CLINIC_OR_DEPARTMENT_OTHER): Payer: Self-pay

## 2022-01-07 ENCOUNTER — Other Ambulatory Visit (HOSPITAL_BASED_OUTPATIENT_CLINIC_OR_DEPARTMENT_OTHER): Payer: Self-pay

## 2022-01-09 ENCOUNTER — Other Ambulatory Visit (HOSPITAL_BASED_OUTPATIENT_CLINIC_OR_DEPARTMENT_OTHER): Payer: Self-pay

## 2022-01-09 MED ORDER — ZOLPIDEM TARTRATE 10 MG PO TABS
10.0000 mg | ORAL_TABLET | Freq: Every day | ORAL | 0 refills | Status: DC | PRN
  Filled 2022-01-09: qty 30, 30d supply, fill #0

## 2022-02-24 ENCOUNTER — Other Ambulatory Visit (HOSPITAL_BASED_OUTPATIENT_CLINIC_OR_DEPARTMENT_OTHER): Payer: Self-pay

## 2022-02-24 MED ORDER — NYSTATIN-TRIAMCINOLONE 100000-0.1 UNIT/GM-% EX OINT
TOPICAL_OINTMENT | CUTANEOUS | 11 refills | Status: DC
  Filled 2022-02-24: qty 30, 15d supply, fill #0

## 2022-03-17 ENCOUNTER — Other Ambulatory Visit (HOSPITAL_BASED_OUTPATIENT_CLINIC_OR_DEPARTMENT_OTHER): Payer: Self-pay

## 2022-03-18 ENCOUNTER — Other Ambulatory Visit (HOSPITAL_BASED_OUTPATIENT_CLINIC_OR_DEPARTMENT_OTHER): Payer: Self-pay

## 2022-03-18 ENCOUNTER — Other Ambulatory Visit: Payer: Self-pay

## 2022-03-18 MED ORDER — ZOLPIDEM TARTRATE 10 MG PO TABS
10.0000 mg | ORAL_TABLET | Freq: Every day | ORAL | 3 refills | Status: DC | PRN
  Filled 2022-03-18: qty 30, 30d supply, fill #0
  Filled 2022-04-28: qty 30, 30d supply, fill #1
  Filled 2022-06-26: qty 30, 30d supply, fill #2
  Filled 2022-08-27: qty 30, 30d supply, fill #3

## 2022-04-27 DIAGNOSIS — M7712 Lateral epicondylitis, left elbow: Secondary | ICD-10-CM | POA: Diagnosis not present

## 2022-04-29 ENCOUNTER — Other Ambulatory Visit: Payer: Self-pay

## 2022-06-22 ENCOUNTER — Other Ambulatory Visit (HOSPITAL_BASED_OUTPATIENT_CLINIC_OR_DEPARTMENT_OTHER): Payer: Self-pay

## 2022-06-22 DIAGNOSIS — Z01419 Encounter for gynecological examination (general) (routine) without abnormal findings: Secondary | ICD-10-CM | POA: Diagnosis not present

## 2022-06-22 DIAGNOSIS — E039 Hypothyroidism, unspecified: Secondary | ICD-10-CM | POA: Diagnosis not present

## 2022-06-22 DIAGNOSIS — Z1231 Encounter for screening mammogram for malignant neoplasm of breast: Secondary | ICD-10-CM | POA: Diagnosis not present

## 2022-06-22 DIAGNOSIS — Z6826 Body mass index (BMI) 26.0-26.9, adult: Secondary | ICD-10-CM | POA: Diagnosis not present

## 2022-06-22 MED ORDER — NORETHINDRON-ETHINYL ESTRAD-FE 1-20/1-30/1-35 MG-MCG PO TABS
1.0000 | ORAL_TABLET | Freq: Every day | ORAL | 4 refills | Status: DC
  Filled 2022-06-22: qty 84, 84d supply, fill #0
  Filled 2022-09-08: qty 84, 84d supply, fill #1
  Filled 2022-12-07: qty 84, 84d supply, fill #2
  Filled 2023-03-01 (×2): qty 84, 84d supply, fill #3
  Filled 2023-05-29: qty 84, 84d supply, fill #4

## 2022-06-25 ENCOUNTER — Other Ambulatory Visit: Payer: Self-pay | Admitting: Obstetrics & Gynecology

## 2022-06-25 DIAGNOSIS — R928 Other abnormal and inconclusive findings on diagnostic imaging of breast: Secondary | ICD-10-CM

## 2022-06-26 ENCOUNTER — Other Ambulatory Visit: Payer: Self-pay

## 2022-07-06 ENCOUNTER — Other Ambulatory Visit: Payer: Self-pay | Admitting: Obstetrics and Gynecology

## 2022-07-06 ENCOUNTER — Ambulatory Visit
Admission: RE | Admit: 2022-07-06 | Discharge: 2022-07-06 | Disposition: A | Discharge status: Home / Self Care | Payer: 59 | Source: Ambulatory Visit | Attending: Obstetrics & Gynecology | Admitting: Obstetrics & Gynecology

## 2022-07-06 ENCOUNTER — Other Ambulatory Visit: Payer: Self-pay | Admitting: Obstetrics & Gynecology

## 2022-07-06 DIAGNOSIS — R922 Inconclusive mammogram: Secondary | ICD-10-CM | POA: Diagnosis not present

## 2022-07-06 DIAGNOSIS — N6489 Other specified disorders of breast: Secondary | ICD-10-CM | POA: Diagnosis not present

## 2022-07-06 DIAGNOSIS — R928 Other abnormal and inconclusive findings on diagnostic imaging of breast: Secondary | ICD-10-CM

## 2022-07-07 ENCOUNTER — Other Ambulatory Visit (HOSPITAL_BASED_OUTPATIENT_CLINIC_OR_DEPARTMENT_OTHER): Payer: Self-pay

## 2022-07-08 DIAGNOSIS — R3121 Asymptomatic microscopic hematuria: Secondary | ICD-10-CM | POA: Diagnosis not present

## 2022-07-09 ENCOUNTER — Other Ambulatory Visit (HOSPITAL_BASED_OUTPATIENT_CLINIC_OR_DEPARTMENT_OTHER): Payer: Self-pay

## 2022-07-09 ENCOUNTER — Other Ambulatory Visit: Payer: Self-pay

## 2022-07-09 MED ORDER — LEVOTHYROXINE SODIUM 50 MCG PO TABS
50.0000 ug | ORAL_TABLET | Freq: Every day | ORAL | 3 refills | Status: DC
  Filled 2022-07-09: qty 90, 90d supply, fill #0
  Filled 2022-10-12: qty 90, 90d supply, fill #1
  Filled 2023-01-11: qty 90, 90d supply, fill #2
  Filled 2023-04-21: qty 90, 90d supply, fill #3

## 2022-07-10 ENCOUNTER — Other Ambulatory Visit: Payer: Commercial Managed Care - PPO

## 2022-07-22 ENCOUNTER — Ambulatory Visit
Admission: RE | Admit: 2022-07-22 | Discharge: 2022-07-22 | Disposition: A | Discharge status: Home / Self Care | Payer: 59 | Source: Ambulatory Visit | Attending: Obstetrics & Gynecology | Admitting: Obstetrics & Gynecology

## 2022-07-22 DIAGNOSIS — R928 Other abnormal and inconclusive findings on diagnostic imaging of breast: Secondary | ICD-10-CM

## 2022-07-22 DIAGNOSIS — N6489 Other specified disorders of breast: Secondary | ICD-10-CM

## 2022-07-22 HISTORY — PX: BREAST BIOPSY: SHX20

## 2022-08-28 ENCOUNTER — Other Ambulatory Visit: Payer: Self-pay

## 2022-10-12 ENCOUNTER — Other Ambulatory Visit (HOSPITAL_BASED_OUTPATIENT_CLINIC_OR_DEPARTMENT_OTHER): Payer: Self-pay

## 2022-10-12 ENCOUNTER — Other Ambulatory Visit: Payer: Self-pay

## 2022-10-13 ENCOUNTER — Other Ambulatory Visit (HOSPITAL_BASED_OUTPATIENT_CLINIC_OR_DEPARTMENT_OTHER): Payer: Self-pay

## 2022-10-13 MED ORDER — ZOLPIDEM TARTRATE 10 MG PO TABS
10.0000 mg | ORAL_TABLET | Freq: Every day | ORAL | 3 refills | Status: DC | PRN
  Filled 2022-10-13: qty 30, 30d supply, fill #0
  Filled 2022-12-09: qty 30, 30d supply, fill #1
  Filled 2023-02-09: qty 30, 30d supply, fill #2
  Filled 2023-04-05: qty 30, 30d supply, fill #3

## 2022-12-08 DIAGNOSIS — M25511 Pain in right shoulder: Secondary | ICD-10-CM | POA: Diagnosis not present

## 2022-12-09 ENCOUNTER — Other Ambulatory Visit (HOSPITAL_BASED_OUTPATIENT_CLINIC_OR_DEPARTMENT_OTHER): Payer: Self-pay

## 2022-12-09 DIAGNOSIS — D225 Melanocytic nevi of trunk: Secondary | ICD-10-CM | POA: Diagnosis not present

## 2022-12-09 DIAGNOSIS — D3617 Benign neoplasm of peripheral nerves and autonomic nervous system of trunk, unspecified: Secondary | ICD-10-CM | POA: Diagnosis not present

## 2022-12-09 DIAGNOSIS — D485 Neoplasm of uncertain behavior of skin: Secondary | ICD-10-CM | POA: Diagnosis not present

## 2022-12-09 DIAGNOSIS — D1801 Hemangioma of skin and subcutaneous tissue: Secondary | ICD-10-CM | POA: Diagnosis not present

## 2022-12-09 DIAGNOSIS — D2272 Melanocytic nevi of left lower limb, including hip: Secondary | ICD-10-CM | POA: Diagnosis not present

## 2022-12-09 DIAGNOSIS — L2089 Other atopic dermatitis: Secondary | ICD-10-CM | POA: Diagnosis not present

## 2022-12-09 DIAGNOSIS — D2371 Other benign neoplasm of skin of right lower limb, including hip: Secondary | ICD-10-CM | POA: Diagnosis not present

## 2022-12-09 DIAGNOSIS — L820 Inflamed seborrheic keratosis: Secondary | ICD-10-CM | POA: Diagnosis not present

## 2022-12-09 DIAGNOSIS — D2362 Other benign neoplasm of skin of left upper limb, including shoulder: Secondary | ICD-10-CM | POA: Diagnosis not present

## 2022-12-09 DIAGNOSIS — L718 Other rosacea: Secondary | ICD-10-CM | POA: Diagnosis not present

## 2022-12-09 MED ORDER — METRONIDAZOLE 0.75 % EX CREA
1.0000 | TOPICAL_CREAM | Freq: Two times a day (BID) | CUTANEOUS | 3 refills | Status: DC
  Filled 2022-12-09: qty 45, 23d supply, fill #0

## 2022-12-15 ENCOUNTER — Other Ambulatory Visit: Payer: Self-pay | Admitting: Obstetrics & Gynecology

## 2022-12-15 DIAGNOSIS — N6099 Unspecified benign mammary dysplasia of unspecified breast: Secondary | ICD-10-CM

## 2023-02-05 ENCOUNTER — Ambulatory Visit: Payer: 59

## 2023-02-05 ENCOUNTER — Ambulatory Visit
Admission: RE | Admit: 2023-02-05 | Discharge: 2023-02-05 | Disposition: A | Discharge status: Home / Self Care | Payer: 59 | Source: Ambulatory Visit | Attending: Obstetrics & Gynecology | Admitting: Obstetrics & Gynecology

## 2023-02-05 DIAGNOSIS — N6031 Fibrosclerosis of right breast: Secondary | ICD-10-CM | POA: Diagnosis not present

## 2023-02-05 DIAGNOSIS — N6099 Unspecified benign mammary dysplasia of unspecified breast: Secondary | ICD-10-CM

## 2023-02-10 ENCOUNTER — Other Ambulatory Visit: Payer: Self-pay

## 2023-03-01 ENCOUNTER — Other Ambulatory Visit (HOSPITAL_BASED_OUTPATIENT_CLINIC_OR_DEPARTMENT_OTHER): Payer: Self-pay

## 2023-03-01 ENCOUNTER — Other Ambulatory Visit (HOSPITAL_COMMUNITY): Payer: Self-pay

## 2023-03-01 DIAGNOSIS — H52222 Regular astigmatism, left eye: Secondary | ICD-10-CM | POA: Diagnosis not present

## 2023-04-05 ENCOUNTER — Other Ambulatory Visit: Payer: Self-pay

## 2023-06-02 ENCOUNTER — Other Ambulatory Visit (HOSPITAL_BASED_OUTPATIENT_CLINIC_OR_DEPARTMENT_OTHER): Payer: Self-pay

## 2023-06-22 ENCOUNTER — Other Ambulatory Visit (HOSPITAL_BASED_OUTPATIENT_CLINIC_OR_DEPARTMENT_OTHER): Payer: Self-pay

## 2023-06-22 ENCOUNTER — Other Ambulatory Visit: Payer: Self-pay | Admitting: Obstetrics and Gynecology

## 2023-06-22 DIAGNOSIS — Z09 Encounter for follow-up examination after completed treatment for conditions other than malignant neoplasm: Secondary | ICD-10-CM

## 2023-06-22 DIAGNOSIS — N6489 Other specified disorders of breast: Secondary | ICD-10-CM

## 2023-06-23 ENCOUNTER — Other Ambulatory Visit (HOSPITAL_BASED_OUTPATIENT_CLINIC_OR_DEPARTMENT_OTHER): Payer: Self-pay

## 2023-06-23 MED ORDER — ZOLPIDEM TARTRATE 10 MG PO TABS
10.0000 mg | ORAL_TABLET | Freq: Every day | ORAL | 3 refills | Status: DC | PRN
  Filled 2023-06-23: qty 30, 30d supply, fill #0
  Filled 2023-08-09: qty 30, 30d supply, fill #1
  Filled 2023-09-24: qty 30, 30d supply, fill #2
  Filled 2023-11-19: qty 30, 30d supply, fill #3

## 2023-07-15 ENCOUNTER — Other Ambulatory Visit (HOSPITAL_BASED_OUTPATIENT_CLINIC_OR_DEPARTMENT_OTHER): Payer: Self-pay

## 2023-07-15 ENCOUNTER — Other Ambulatory Visit: Payer: Self-pay | Admitting: Obstetrics and Gynecology

## 2023-07-15 DIAGNOSIS — Z01419 Encounter for gynecological examination (general) (routine) without abnormal findings: Secondary | ICD-10-CM | POA: Diagnosis not present

## 2023-07-15 DIAGNOSIS — Z13820 Encounter for screening for osteoporosis: Secondary | ICD-10-CM | POA: Diagnosis not present

## 2023-07-15 DIAGNOSIS — Z3040 Encounter for surveillance of contraceptives, unspecified: Secondary | ICD-10-CM | POA: Diagnosis not present

## 2023-07-15 DIAGNOSIS — R1011 Right upper quadrant pain: Secondary | ICD-10-CM

## 2023-07-15 DIAGNOSIS — Z6825 Body mass index (BMI) 25.0-25.9, adult: Secondary | ICD-10-CM | POA: Diagnosis not present

## 2023-07-15 MED ORDER — TRI-LEGEST FE 1-20/1-30/1-35 MG-MCG PO TABS
1.0000 | ORAL_TABLET | Freq: Every day | ORAL | 4 refills | Status: AC
  Filled 2023-07-15 – 2023-08-31 (×2): qty 84, 84d supply, fill #0
  Filled 2023-11-19: qty 84, 84d supply, fill #1
  Filled 2024-02-11: qty 84, 84d supply, fill #2
  Filled 2024-04-16 – 2024-04-18 (×2): qty 84, 84d supply, fill #3

## 2023-07-20 ENCOUNTER — Other Ambulatory Visit (HOSPITAL_BASED_OUTPATIENT_CLINIC_OR_DEPARTMENT_OTHER): Payer: Self-pay

## 2023-07-21 ENCOUNTER — Other Ambulatory Visit (HOSPITAL_BASED_OUTPATIENT_CLINIC_OR_DEPARTMENT_OTHER): Payer: Self-pay

## 2023-07-21 MED ORDER — LEVOTHYROXINE SODIUM 50 MCG PO TABS
50.0000 ug | ORAL_TABLET | Freq: Every day | ORAL | 3 refills | Status: AC
  Filled 2023-07-21: qty 90, 90d supply, fill #0
  Filled 2023-10-14: qty 90, 90d supply, fill #1
  Filled 2024-01-14: qty 90, 90d supply, fill #2
  Filled 2024-04-14: qty 30, 30d supply, fill #3

## 2023-07-30 ENCOUNTER — Encounter: Payer: Self-pay | Admitting: Obstetrics and Gynecology

## 2023-08-02 ENCOUNTER — Ambulatory Visit
Admission: RE | Admit: 2023-08-02 | Discharge: 2023-08-02 | Disposition: A | Discharge status: Home / Self Care | Source: Ambulatory Visit | Attending: Obstetrics and Gynecology | Admitting: Obstetrics and Gynecology

## 2023-08-02 DIAGNOSIS — R1011 Right upper quadrant pain: Secondary | ICD-10-CM | POA: Diagnosis not present

## 2023-08-09 ENCOUNTER — Ambulatory Visit
Admission: RE | Admit: 2023-08-09 | Discharge: 2023-08-09 | Disposition: A | Discharge status: Home / Self Care | Source: Ambulatory Visit | Attending: Obstetrics and Gynecology | Admitting: Obstetrics and Gynecology

## 2023-08-09 DIAGNOSIS — R928 Other abnormal and inconclusive findings on diagnostic imaging of breast: Secondary | ICD-10-CM | POA: Diagnosis not present

## 2023-08-09 DIAGNOSIS — Z09 Encounter for follow-up examination after completed treatment for conditions other than malignant neoplasm: Secondary | ICD-10-CM

## 2023-08-09 DIAGNOSIS — N6489 Other specified disorders of breast: Secondary | ICD-10-CM

## 2023-08-10 ENCOUNTER — Other Ambulatory Visit: Payer: Self-pay

## 2023-08-31 ENCOUNTER — Other Ambulatory Visit (HOSPITAL_BASED_OUTPATIENT_CLINIC_OR_DEPARTMENT_OTHER): Payer: Self-pay

## 2023-09-01 ENCOUNTER — Other Ambulatory Visit (HOSPITAL_BASED_OUTPATIENT_CLINIC_OR_DEPARTMENT_OTHER): Payer: Self-pay

## 2023-09-24 ENCOUNTER — Other Ambulatory Visit: Payer: Self-pay

## 2023-10-01 ENCOUNTER — Ambulatory Visit: Admitting: Physician Assistant

## 2023-10-06 ENCOUNTER — Other Ambulatory Visit (INDEPENDENT_AMBULATORY_CARE_PROVIDER_SITE_OTHER)

## 2023-10-06 ENCOUNTER — Ambulatory Visit (INDEPENDENT_AMBULATORY_CARE_PROVIDER_SITE_OTHER): Admitting: Gastroenterology

## 2023-10-06 ENCOUNTER — Encounter: Payer: Self-pay | Admitting: Gastroenterology

## 2023-10-06 VITALS — BP 116/82 | HR 85 | Ht 61.0 in | Wt 132.0 lb

## 2023-10-06 DIAGNOSIS — R109 Unspecified abdominal pain: Secondary | ICD-10-CM | POA: Diagnosis not present

## 2023-10-06 DIAGNOSIS — R1031 Right lower quadrant pain: Secondary | ICD-10-CM

## 2023-10-06 DIAGNOSIS — R932 Abnormal findings on diagnostic imaging of liver and biliary tract: Secondary | ICD-10-CM

## 2023-10-06 DIAGNOSIS — R197 Diarrhea, unspecified: Secondary | ICD-10-CM | POA: Diagnosis not present

## 2023-10-06 DIAGNOSIS — R195 Other fecal abnormalities: Secondary | ICD-10-CM

## 2023-10-06 DIAGNOSIS — R194 Change in bowel habit: Secondary | ICD-10-CM

## 2023-10-06 DIAGNOSIS — R152 Fecal urgency: Secondary | ICD-10-CM | POA: Diagnosis not present

## 2023-10-06 LAB — PROTIME-INR
INR: 1 ratio (ref 0.8–1.0)
Prothrombin Time: 10.4 s (ref 9.6–13.1)

## 2023-10-06 LAB — HEPATIC FUNCTION PANEL
ALT: 16 U/L (ref 0–35)
AST: 14 U/L (ref 0–37)
Albumin: 4.4 g/dL (ref 3.5–5.2)
Alkaline Phosphatase: 36 U/L — ABNORMAL LOW (ref 39–117)
Bilirubin, Direct: 0.2 mg/dL (ref 0.0–0.3)
Total Bilirubin: 0.6 mg/dL (ref 0.2–1.2)
Total Protein: 7.1 g/dL (ref 6.0–8.3)

## 2023-10-06 LAB — IBC + FERRITIN
Ferritin: 113.7 ng/mL (ref 10.0–291.0)
Iron: 137 ug/dL (ref 42–145)
Saturation Ratios: 37.9 % (ref 20.0–50.0)
TIBC: 361.2 ug/dL (ref 250.0–450.0)
Transferrin: 258 mg/dL (ref 212.0–360.0)

## 2023-10-06 NOTE — Progress Notes (Signed)
 Jennifer Carlson [id number] 08/13/1970   Chief Complaint: Abdominal pain, fatty liver  Referring Provider: Mat Browning, MD Primary GI MD: Dr. Avram  HPI: Jennifer Carlson is a 53 y.o. female with past medical history of asthma, hypothyroidism who presents today for a complaint of abdominal pain and fatty liver.    Abdominal ultrasound 08/02/2023 showed increased echotexture of the liver as can be seen in fatty infiltration of the liver.  Labs 07/16/2023 (see referral note from OBGYN): Hemoglobin A1c 4.9, TSH 1.35, normal lipid panel, normal CMP with AST 17, ALT 17, alk phos 44, total bilirubin 0.5, albumin 4.2, normal hemoglobin and hematocrit but slightly elevated MCV 99, platelets 413   Patient states she has been having right-sided abdominal pain for over a year.  Pain is located lateral to the umbilicus and in RLQ.  States that she mentioned this pain about a year ago to her OB/GYN.  Had pain during pelvic exam at that time.  Was told pain was musculoskeletal.  She continued to have pain over the course of the year, mentioned it again at her appointment with OB/GYN in April and abdominal ultrasound was ordered to evaluate the gallbladder and was normal aside from possible fatty liver.  Patient states pain comes and goes, can be sharp.  Has not been worsening but is noticeable and bothersome.  Does not appear to be associated with eating or with bowel movements.  She has noticed a change in her bowel movements over the last year.  Previously has had problems with constipation and would always have formed stools, now having looser stools and fecal urgency.  Reports history of hemorrhoids.  Denies blood in her stool.  Denies watery diarrhea.  Patient states that she does not drink milk but does consume some dairy products such as yogurt and cottage cheese.  She has not noticed any worsening of her pain with consumption of dairy.  No clear correlation with any particular  foods.  Denies new medications.  Patient states she drinks alcohol on the weekends, 2 drinks per night on Friday, Saturday, and Sunday.  She denies any smoking.  Denies family history of liver problems.  Denies history of hepatitis.  Denies fever/chills.  Previous GI Procedures/Imaging   Abdominal ultrasound 07/25/2023 1. No acute abnormality identified. 2. Increased echotexture of the liver. This is a nonspecific finding but can be seen in fatty infiltration of liver.  Colonoscopy 09/19/2020 - External and internal hemorrhoids.  - The examination was otherwise normal on direct and retroflexion views.  - No specimens collected. - Recall 5 years due to family history of colon cancer (sister)   Past Medical History:  Diagnosis Date   Asthma    Thyroid  disease    hypothyroid    Past Surgical History:  Procedure Laterality Date   BREAST BIOPSY Right 07/22/2022   MM RT BREAST BX W LOC DEV 1ST LESION IMAGE BX SPEC STEREO GUIDE 07/22/2022 [facility]   CESAREAN SECTION     x 2 (2002, 2004)   UMBILICAL HERNIA REPAIR      Current Outpatient Medications  Medication Sig Dispense Refill   albuterol  (PROVENTIL  HFA;VENTOLIN  HFA) 108 (90 Base) MCG/ACT inhaler 1-2 puffs 30 minutes prior to exercise 2 Inhaler 3   albuterol  (VENTOLIN  HFA) 108 (90 Base) MCG/ACT inhaler INHALE 1-2 puffs BY MOUTH  30 minutes prior to exercise 36 g 3   diphenhydrAMINE HCl (BENADRYL PO) Take by mouth at bedtime.     docusate sodium (COLACE) 50  MG capsule Take 50 mg by mouth at bedtime.     fluorometholone  (FML) 0.1 % ophthalmic suspension Instill 1 drop into both eyes four times a day as directed 5 mL 0   fluorouracil  (EFUDEX ) 5 % cream Apply to forehead every night for two weeks. 40 g 0   ipratropium-albuterol  (DUONEB) 0.5-2.5 (3) MG/3ML SOLN Take 3 mLs by nebulization every 4 (four) hours as needed. (Patient not taking: No sig reported) 360 mL 3   levothyroxine  (EUTHYROX ) 50 MCG tablet Take 1 tablet (50  mcg total) by mouth daily. 90 tablet 3   levothyroxine  (SYNTHROID ) 50 MCG tablet Take 1 tablet by mouth everyday 30 tablet 7   metroNIDAZOLE  (METROCREAM ) 0.75 % cream Apply a dime sized amount to face twice a day as directed.  Taper use to daily once improved. 45 g 3   Multiple Vitamin (MULTIVITAMIN ADULT PO) Take by mouth daily.     norethindrone -ethinyl estradiol-iron (ESTROSTEP FE) 1-20/1-30/1-35 MG-MCG tablet TAKE 1 TABLET BY MOUTH ONCE DAILY 84 tablet 4   norethindrone -ethinyl estradiol-iron (ESTROSTEP FE) 1-20/1-30/1-35 MG-MCG tablet TAKE 1 TABLET BY MOUTH ONCE DAILY 84 tablet 0   norethindrone -ethinyl estradiol-iron (TILIA FE ) 1-20/1-30/1-35 MG-MCG tablet Take 1 tablet by mouth daily 84 tablet 3   norethindrone -ethinyl estradiol-iron (TRI-LEGEST  FE) 1-20/1-30/1-35 MG-MCG tablet Take 1 tablet every day by oral route for 84 days. 84 tablet 4   norethindrone -ethinyl estradiol-iron (TRI-LEGEST  FE) 1-20/1-30/1-35 MG-MCG tablet Take 1 tablet by mouth daily. 84 tablet 4   nystatin -triamcinolone  ointment (MYCOLOG) Apply topically to corner of mouth up to 4 times a day. 30 g 11   predniSONE  (DELTASONE ) 50 MG tablet Take 1 tablet (50 mg total) by mouth daily. (Patient not taking: Reported on 09/19/2020) 5 tablet 0   thyroid  (ARMOUR) 30 MG tablet TAKE 1 TABLET BY MOUTH EVERYDAY 30 tablet 11   TRI-LEGEST  FE 1-20/1-30/1-35 MG-MCG tablet Take 1 tablet by mouth daily.  3   triamcinolone  cream (KENALOG ) 0.1 % Apply a small amount to skin twice a day 454 g 0   vitamin C (ASCORBIC ACID) 500 MG tablet Take 500 mg by mouth daily.     Zinc 50 MG TABS Take by mouth daily.     zolpidem  (AMBIEN ) 10 MG tablet Take 1 tablet (10 mg total) by mouth at bedtime as needed for sleep. 30 tablet 2   zolpidem  (AMBIEN ) 10 MG tablet TAKE 1 TABLET BY MOUTH ONCE DAILY 30 tablet 0   zolpidem  (AMBIEN ) 10 MG tablet TAKE 1 TABLET BY MOUTH ONCE DAILY 30 tablet 0   zolpidem  (AMBIEN ) 10 MG tablet Take 1 tablet (10 mg total) by mouth  daily as needed. 30 tablet 3   No current facility-administered medications for this visit.    Allergies as of 10/06/2023 - Review Complete 09/19/2020  Allergen Reaction Noted   Augmentin [amoxicillin-pot clavulanate] Hives 06/03/2017    Family History  Problem Relation Age of Onset   Colon polyps Mother    Colon polyps Father    Colon cancer Sister    Colon polyps Sister    Breast cancer Paternal Grandmother    Esophageal cancer Neg Hx    Rectal cancer Neg Hx    Stomach cancer Neg Hx     Social History   Tobacco Use   Smoking status: Never   Smokeless tobacco: Never  Vaping Use   Vaping status: Never Used  Substance Use Topics   Alcohol use: Yes    Alcohol/week: 2.0 standard drinks of alcohol  Types: 2 Standard drinks or equivalent per week   Drug use: No     Review of Systems:    Constitutional: No weight loss, fever, chills, weakness or fatigue Eyes: No change in vision Ears, Nose, Throat:  No change in hearing or congestion Skin: No rash or itching Cardiovascular: No chest pain, chest pressure or palpitations   Respiratory: No SOB or cough Gastrointestinal: See HPI and otherwise negative Genitourinary: No dysuria or change in urinary frequency Neurological: No headache, dizziness or syncope Musculoskeletal: No new muscle or joint pain Hematologic: No bleeding or bruising    Physical Exam:  Vital signs: BP 116/82   Pulse 85   Ht 5' 1 (1.549 m)   Wt 132 lb (59.9 kg)   BMI 24.94 kg/m    Constitutional: NAD, Well developed, Well nourished, alert and cooperative Head:  Normocephalic and atraumatic.  Eyes: No scleral icterus. Conjunctiva pink. Mouth: No oral lesions. Respiratory: Respirations even and unlabored. Lungs clear to auscultation bilaterally.  No wheezes, crackles, or rhonchi.  Cardiovascular:  Regular rate and rhythm. No murmurs. No peripheral edema. Gastrointestinal:  Soft, nondistended, nontender. No rebound or guarding. Normal bowel  sounds. No appreciable masses or hepatomegaly. Rectal:  Not performed.  Neurologic:  Alert and oriented x4;  grossly normal neurologically.  Skin:   Dry and intact without significant lesions or rashes. Psychiatric: Oriented to person, place and time. Demonstrates good judgement and reason without abnormal affect or behaviors.   Assessment/Plan:   Abdominal pain Loose stools Fecal urgency Change in bowel habits Patient with intermittent right lower quadrant abdominal pain which has been ongoing for the last year.  Pain is sharp in nature.  No clear association with eating or with bowel movements.  She has noticed in the last year her stools have been more loose but has not been having any watery diarrhea.  Denies any blood in her stool.   Abdominal ultrasound showed a normal gallbladder.  Last colonoscopy 2022 with finding of internal hemorrhoids and 5-year recall recommended due to family history of colon cancer in her sister.  - Will order CT A/P to further evaluate RLQ abdominal pain - If pain persists/worsens and no cause found, consider colonoscopy for abdominal pain and change in bowel habits.  Abnormal finding on liver imaging Finding of increased echotexture of the liver on abdominal ultrasound 07/2023.  Patient does drink alcohol, approximately 6 drinks per week.  Liver enzymes are normal. FIB-4 Score 0.51, approximate fibrosis stage 0-1, advanced fibrosis excluded Will pursue further workup to exclude other etiologies.  - Check labs today: HFP, PT/INR, iron panel, ferritin, TTG, IgA, Alpha 1 antitrypsin, ceruloplasmin, HepCAb, HepBsAg, HepBsAb, HepBcAb, HAV, IgG, ANA, ASMA, AMA - Abstain from all alcohol including beer, wine, liquor, and non-alcoholic beer.  - Work to maintain a healthy weight through portion control and exercise    Camie Furbish, PA-C [facility] 10/06/2023, 8:29 AM  Patient Care Team: Mat Browning, MD as PCP - General (Obstetrics and  Gynecology) Dannielle Bouchard, DO as Consulting Physician (Obstetrics and Gynecology)

## 2023-10-06 NOTE — Patient Instructions (Addendum)
 You have been scheduled for a CT scan of the abdomen and pelvis at [facility], 1st floor Radiology. You are scheduled on __________ at ___________.   You will be contacted by [facility] Scheduling in the next 2 days to arrange a CT Abdomen/Pelvis.  The number on your caller ID will be [phone], please answer when they call.  If you have not heard from them in 2 days please call [phone] to schedule.    Your provider has requested that you go to the basement level for lab work before leaving today. Press B on the elevator. The lab is located at the first door on the left as you exit the elevator.  Limit Alcohol  IBgard samples    Due to recent changes in healthcare laws, you may see the results of your imaging and laboratory studies on MyChart before your provider has had a chance to review them.  We understand that in some cases there may be results that are confusing or concerning to you. Not all laboratory results come back in the same time frame and the provider may be waiting for multiple results in order to interpret others.  Please give us  48 hours in order for your provider to thoroughly review all the results before contacting the office for clarification of your results.    I appreciate the  opportunity to care for you  Thank You   Camie BRAVO Heinz,PA-C

## 2023-10-07 LAB — HEPATITIS B CORE ANTIBODY, TOTAL: Hep B Core Total Ab: NONREACTIVE

## 2023-10-10 LAB — HEPATITIS B SURFACE ANTIGEN: Hepatitis B Surface Ag: NONREACTIVE

## 2023-10-10 LAB — HEPATITIS C ANTIBODY: Hepatitis C Ab: NONREACTIVE

## 2023-10-10 LAB — ANA: Anti Nuclear Antibody (ANA): POSITIVE — AB

## 2023-10-10 LAB — ANTI-SMOOTH MUSCLE ANTIBODY, IGG: Actin (Smooth Muscle) Antibody (IGG): <20 U (ref <20)

## 2023-10-10 LAB — ALPHA-1-ANTITRYPSIN: A-1 Antitrypsin, Ser: 169 mg/dL (ref 83–199)

## 2023-10-10 LAB — IGA: Immunoglobulin A: 177 mg/dL (ref 47–310)

## 2023-10-10 LAB — MITOCHONDRIAL ANTIBODIES: Mitochondrial M2 Ab, IgG: <=20 U (ref <=20.0)

## 2023-10-10 LAB — CERULOPLASMIN: Ceruloplasmin: 45 mg/dL (ref 14–48)

## 2023-10-10 LAB — HEPATITIS B CORE ANTIBODY, TOTAL: Hep B Core Total Ab: NONREACTIVE

## 2023-10-10 LAB — HEPATITIS A ANTIBODY, TOTAL: Hepatitis A AB,Total: REACTIVE — AB

## 2023-10-10 LAB — ANTI-NUCLEAR AB-TITER (ANA TITER): ANA Titer 1: 1:80 {titer} — ABNORMAL HIGH

## 2023-10-10 LAB — TISSUE TRANSGLUTAMINASE, IGA: (tTG) Ab, IgA: <1 U/mL

## 2023-10-11 ENCOUNTER — Ambulatory Visit (HOSPITAL_BASED_OUTPATIENT_CLINIC_OR_DEPARTMENT_OTHER)
Admission: RE | Admit: 2023-10-11 | Discharge: 2023-10-11 | Disposition: A | Discharge status: Home / Self Care | Source: Ambulatory Visit | Attending: Gastroenterology | Admitting: Gastroenterology

## 2023-10-11 DIAGNOSIS — R109 Unspecified abdominal pain: Secondary | ICD-10-CM | POA: Diagnosis not present

## 2023-10-11 DIAGNOSIS — R1031 Right lower quadrant pain: Secondary | ICD-10-CM | POA: Diagnosis not present

## 2023-10-11 DIAGNOSIS — R194 Change in bowel habit: Secondary | ICD-10-CM | POA: Insufficient documentation

## 2023-10-11 DIAGNOSIS — R932 Abnormal findings on diagnostic imaging of liver and biliary tract: Secondary | ICD-10-CM | POA: Insufficient documentation

## 2023-10-11 DIAGNOSIS — K769 Liver disease, unspecified: Secondary | ICD-10-CM | POA: Diagnosis not present

## 2023-10-11 DIAGNOSIS — R195 Other fecal abnormalities: Secondary | ICD-10-CM | POA: Insufficient documentation

## 2023-10-11 MED ORDER — IOHEXOL 300 MG/ML  SOLN
100.0000 mL | Freq: Once | INTRAMUSCULAR | Status: AC | PRN
  Administered 2023-10-11: 100 mL via INTRAVENOUS

## 2023-10-12 ENCOUNTER — Ambulatory Visit: Payer: Self-pay | Admitting: Gastroenterology

## 2023-10-13 ENCOUNTER — Telehealth: Payer: Self-pay | Admitting: *Deleted

## 2023-10-13 NOTE — Telephone Encounter (Signed)
 [phone] (Home Phone)   Immune to hepatitis A. Negative for hepatitis B.  Negative for Wilson's disease.  Negative for alpha 1 antitrypsin deficiency.  Negative for celiac disease.  Normal iron.   Positive ANA is a nonspecific indicator of possible autoimmune disease but is not specific to the liver. ANA titer shows a low antibody level, which can be seen in normal individuals. Liver enzymes are normal, which makes autoimmune hepatitis less likely.  Other autoimmune markers are negative.   Recommend PCP follow up to discuss positive ANA and whether referral to a rheumatologist may be warranted.   Spoke with patient gave her lab results. She said she does not have a PCP and I recommended she call [facility] to see which doctors are seeing new patients

## 2023-10-13 NOTE — Telephone Encounter (Signed)
 When I spoke to patient about her labs after the conversation she mentioned rectal pain over the weekend. Like a possible fissure. She said Jennifer Carlson had told her to use Hydrocortisone Cream.   The patient  said she tried the cream and it didn't work which the hydrocortisone she used was out of date. I told the patient to purchase RectiCare with lidocaine for the pain, that she would have to ask the pharmacist for it. I offered her an appointment with her GI physician if she wanted to check and make sure this is not hemorrhoids because Dr Avram bands hemorrhoids. She said she is not interested in that at this time and wants to try the RectiCare I also told her to contact the office in a few days if no relief and we would have to see her in the office to make sure that it is a fissure before prescribing Nitroglycerin or Diltiazem gel.She said she wanted to try something over the counter first.  She will call back as needed  FYI Jennifer Carlson

## 2023-10-14 ENCOUNTER — Other Ambulatory Visit (HOSPITAL_BASED_OUTPATIENT_CLINIC_OR_DEPARTMENT_OTHER): Payer: Self-pay

## 2023-10-27 ENCOUNTER — Other Ambulatory Visit (HOSPITAL_BASED_OUTPATIENT_CLINIC_OR_DEPARTMENT_OTHER): Payer: Self-pay

## 2023-10-27 MED ORDER — PROCTOFOAM HC 1-1 % EX FOAM
CUTANEOUS | 1 refills | Status: AC
  Filled 2023-10-27: qty 10, 30d supply, fill #0

## 2023-11-12 ENCOUNTER — Other Ambulatory Visit: Payer: Self-pay

## 2023-11-12 ENCOUNTER — Other Ambulatory Visit (HOSPITAL_COMMUNITY): Payer: Self-pay | Admitting: General Surgery

## 2023-11-12 ENCOUNTER — Other Ambulatory Visit (HOSPITAL_BASED_OUTPATIENT_CLINIC_OR_DEPARTMENT_OTHER): Payer: Self-pay

## 2023-11-12 DIAGNOSIS — K602 Anal fissure, unspecified: Secondary | ICD-10-CM

## 2023-11-12 MED ORDER — NITROGLYCERIN 0.4 % RE OINT
1.0000 | TOPICAL_OINTMENT | Freq: Two times a day (BID) | RECTAL | 1 refills | Status: AC
  Filled 2023-11-12 – 2023-12-13 (×3): qty 30, 15d supply, fill #0

## 2023-11-19 ENCOUNTER — Other Ambulatory Visit: Payer: Self-pay

## 2023-11-19 ENCOUNTER — Other Ambulatory Visit (HOSPITAL_BASED_OUTPATIENT_CLINIC_OR_DEPARTMENT_OTHER): Payer: Self-pay

## 2023-11-24 DIAGNOSIS — Z6825 Body mass index (BMI) 25.0-25.9, adult: Secondary | ICD-10-CM | POA: Diagnosis not present

## 2023-11-24 DIAGNOSIS — R768 Other specified abnormal immunological findings in serum: Secondary | ICD-10-CM | POA: Diagnosis not present

## 2023-11-24 DIAGNOSIS — E663 Overweight: Secondary | ICD-10-CM | POA: Diagnosis not present

## 2023-11-29 DIAGNOSIS — K601 Chronic anal fissure: Secondary | ICD-10-CM | POA: Diagnosis not present

## 2023-12-13 ENCOUNTER — Other Ambulatory Visit (HOSPITAL_BASED_OUTPATIENT_CLINIC_OR_DEPARTMENT_OTHER): Payer: Self-pay

## 2023-12-13 ENCOUNTER — Ambulatory Visit: Admitting: Gastroenterology

## 2024-01-19 ENCOUNTER — Other Ambulatory Visit (HOSPITAL_BASED_OUTPATIENT_CLINIC_OR_DEPARTMENT_OTHER): Payer: Self-pay

## 2024-01-19 MED ORDER — ZOLPIDEM TARTRATE 10 MG PO TABS
10.0000 mg | ORAL_TABLET | Freq: Every evening | ORAL | 3 refills | Status: AC | PRN
  Filled 2024-01-19: qty 30, 30d supply, fill #0
  Filled 2024-03-20: qty 30, 30d supply, fill #1

## 2024-01-24 ENCOUNTER — Ambulatory Visit: Admitting: Gastroenterology

## 2024-02-11 ENCOUNTER — Other Ambulatory Visit (HOSPITAL_BASED_OUTPATIENT_CLINIC_OR_DEPARTMENT_OTHER): Payer: Self-pay

## 2024-03-14 ENCOUNTER — Other Ambulatory Visit (HOSPITAL_BASED_OUTPATIENT_CLINIC_OR_DEPARTMENT_OTHER): Payer: Self-pay

## 2024-03-14 ENCOUNTER — Telehealth: Payer: Self-pay

## 2024-03-14 DIAGNOSIS — D1801 Hemangioma of skin and subcutaneous tissue: Secondary | ICD-10-CM | POA: Diagnosis not present

## 2024-03-14 DIAGNOSIS — I788 Other diseases of capillaries: Secondary | ICD-10-CM | POA: Diagnosis not present

## 2024-03-14 DIAGNOSIS — D225 Melanocytic nevi of trunk: Secondary | ICD-10-CM | POA: Diagnosis not present

## 2024-03-14 DIAGNOSIS — L570 Actinic keratosis: Secondary | ICD-10-CM | POA: Diagnosis not present

## 2024-03-14 DIAGNOSIS — D2239 Melanocytic nevi of other parts of face: Secondary | ICD-10-CM | POA: Diagnosis not present

## 2024-03-14 DIAGNOSIS — L814 Other melanin hyperpigmentation: Secondary | ICD-10-CM | POA: Diagnosis not present

## 2024-03-14 DIAGNOSIS — L718 Other rosacea: Secondary | ICD-10-CM | POA: Diagnosis not present

## 2024-03-14 DIAGNOSIS — D2372 Other benign neoplasm of skin of left lower limb, including hip: Secondary | ICD-10-CM | POA: Diagnosis not present

## 2024-03-14 MED ORDER — BRIMONIDINE TARTRATE 0.33 % EX GEL
CUTANEOUS | 5 refills | Status: AC
  Filled 2024-03-14: qty 30, 30d supply, fill #0

## 2024-03-14 MED ORDER — FLUOROURACIL 5 % EX CREA
TOPICAL_CREAM | CUTANEOUS | 0 refills | Status: AC
  Filled 2024-03-14: qty 40, 14d supply, fill #0

## 2024-03-14 NOTE — Telephone Encounter (Signed)
 Not an office E2C2 covers.   Copied from CRM [id number]. Topic: Clinical - Prescription Issue >> Mar 14, 2024  3:52 PM Jennifer Carlson wrote: Reason for CRM: Jennifer Carlson from [facility] called said they are not able to change medication to a different generic  medication for levothyroxine  (EUTHYROX ) 50 MCG tablet [[id number]] - sent over a fax with the changes and are requesting we sign it and send it back.

## 2024-03-15 ENCOUNTER — Other Ambulatory Visit (HOSPITAL_BASED_OUTPATIENT_CLINIC_OR_DEPARTMENT_OTHER): Payer: Self-pay

## 2024-03-20 ENCOUNTER — Other Ambulatory Visit: Payer: Self-pay

## 2024-04-14 ENCOUNTER — Other Ambulatory Visit: Payer: Self-pay

## 2024-04-14 ENCOUNTER — Other Ambulatory Visit (HOSPITAL_BASED_OUTPATIENT_CLINIC_OR_DEPARTMENT_OTHER): Payer: Self-pay

## 2024-04-17 ENCOUNTER — Other Ambulatory Visit (HOSPITAL_BASED_OUTPATIENT_CLINIC_OR_DEPARTMENT_OTHER): Payer: Self-pay

## 2024-04-18 ENCOUNTER — Other Ambulatory Visit (HOSPITAL_BASED_OUTPATIENT_CLINIC_OR_DEPARTMENT_OTHER): Payer: Self-pay
